# Patient Record
Sex: Female | Born: 1970 | Race: White | Hispanic: No | Smoking: Former smoker
Health system: Southern US, Community
[De-identification: ages and names within clinical notes are randomized; demographics above are authoritative.]

## PROBLEM LIST (undated history)

## (undated) DIAGNOSIS — K219 Gastro-esophageal reflux disease without esophagitis: Secondary | ICD-10-CM

## (undated) DIAGNOSIS — E0590 Thyrotoxicosis, unspecified without thyrotoxic crisis or storm: Secondary | ICD-10-CM

## (undated) DIAGNOSIS — E039 Hypothyroidism, unspecified: Secondary | ICD-10-CM

## (undated) DIAGNOSIS — N390 Urinary tract infection, site not specified: Secondary | ICD-10-CM

## (undated) DIAGNOSIS — Z9889 Other specified postprocedural states: Secondary | ICD-10-CM

## (undated) DIAGNOSIS — R112 Nausea with vomiting, unspecified: Secondary | ICD-10-CM

## (undated) DIAGNOSIS — T4145XA Adverse effect of unspecified anesthetic, initial encounter: Secondary | ICD-10-CM

## (undated) DIAGNOSIS — T8859XA Other complications of anesthesia, initial encounter: Secondary | ICD-10-CM

## (undated) HISTORY — DX: Urinary tract infection, site not specified: N39.0

## (undated) HISTORY — PX: CARPAL TUNNEL RELEASE: SHX101

## (undated) HISTORY — DX: Thyrotoxicosis, unspecified without thyrotoxic crisis or storm: E05.90

## (undated) HISTORY — DX: Hypothyroidism, unspecified: E03.9

---
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
## 2000-01-05 ENCOUNTER — Ambulatory Visit (HOSPITAL_COMMUNITY): Admission: RE | Admit: 2000-01-05 | Discharge: 2000-01-05 | Payer: Self-pay | Admitting: Family Medicine

## 2000-01-06 ENCOUNTER — Encounter: Payer: Self-pay | Admitting: Family Medicine

## 2003-01-28 ENCOUNTER — Emergency Department (HOSPITAL_COMMUNITY): Admission: EM | Admit: 2003-01-28 | Discharge: 2003-01-28 | Payer: Self-pay | Admitting: Emergency Medicine

## 2007-04-24 ENCOUNTER — Encounter: Admission: RE | Admit: 2007-04-24 | Discharge: 2007-04-24 | Payer: Self-pay | Admitting: Endocrinology

## 2007-05-12 ENCOUNTER — Encounter: Admission: RE | Admit: 2007-05-12 | Discharge: 2007-05-12 | Payer: Self-pay | Admitting: Endocrinology

## 2008-04-19 ENCOUNTER — Encounter: Admission: RE | Admit: 2008-04-19 | Discharge: 2008-04-19 | Payer: Self-pay | Admitting: Endocrinology

## 2011-08-17 DIAGNOSIS — M25579 Pain in unspecified ankle and joints of unspecified foot: Secondary | ICD-10-CM | POA: Insufficient documentation

## 2013-10-03 DIAGNOSIS — E039 Hypothyroidism, unspecified: Secondary | ICD-10-CM | POA: Insufficient documentation

## 2015-05-14 LAB — HM PAP SMEAR

## 2017-07-12 ENCOUNTER — Ambulatory Visit: Payer: 59 | Admitting: Obstetrics and Gynecology

## 2017-07-13 ENCOUNTER — Other Ambulatory Visit: Payer: Self-pay | Admitting: Nurse Practitioner

## 2017-07-13 DIAGNOSIS — Z1231 Encounter for screening mammogram for malignant neoplasm of breast: Secondary | ICD-10-CM

## 2017-08-03 ENCOUNTER — Encounter: Payer: Self-pay | Admitting: Obstetrics and Gynecology

## 2017-08-03 ENCOUNTER — Other Ambulatory Visit: Payer: Self-pay

## 2017-08-03 ENCOUNTER — Ambulatory Visit (INDEPENDENT_AMBULATORY_CARE_PROVIDER_SITE_OTHER): Payer: 59 | Admitting: Obstetrics and Gynecology

## 2017-08-03 VITALS — BP 108/74 | HR 76 | Ht 67.0 in | Wt 207.0 lb

## 2017-08-03 DIAGNOSIS — Z1231 Encounter for screening mammogram for malignant neoplasm of breast: Secondary | ICD-10-CM | POA: Diagnosis not present

## 2017-08-03 DIAGNOSIS — Z30432 Encounter for removal of intrauterine contraceptive device: Secondary | ICD-10-CM | POA: Diagnosis not present

## 2017-08-03 DIAGNOSIS — Z01419 Encounter for gynecological examination (general) (routine) without abnormal findings: Secondary | ICD-10-CM | POA: Diagnosis not present

## 2017-08-03 DIAGNOSIS — Z1239 Encounter for other screening for malignant neoplasm of breast: Secondary | ICD-10-CM

## 2017-08-03 DIAGNOSIS — N920 Excessive and frequent menstruation with regular cycle: Secondary | ICD-10-CM

## 2017-08-03 NOTE — Progress Notes (Signed)
PCP:  Patient, No Pcp Per   Chief Complaint  Patient presents with  . Gynecologic Exam    IUD Removal. No complaints     HPI:      [name] is a 46 y.o. [phone] who LMP was Patient's last menstrual period was 07/14/2017., presents today for her annual examination.  Her menses are regular every 28-30 days, lasting 2 days.  Dysmenorrhea none. She does not have intermenstrual bleeding. Mirena placed 02/16/12 for menorrhagia. Doesn't want replacement for now.   Sex activity: not sexually active.  Last Pap: May 14, 2015  Results were: no abnormalities /neg HPV DNA   Last mammogram: Sched 08/17/17 There is no FH of breast cancer. There is no FH of ovarian cancer. The patient does do self-breast exams.  Tobacco use: The patient denies current or previous tobacco use. Alcohol use: none No drug use.  Exercise: not active  She does get adequate calcium but not Vitamin D in her diet.   Past Medical History:  Diagnosis Date  . Hyperthyroidism   . Hypothyroidism   . UTI (urinary tract infection)     Past Surgical History:  Procedure Laterality Date  . CARPAL TUNNEL RELEASE      Family History  Problem Relation Age of Onset  . Hypertension Mother   . Hypertension Father   . Skin cancer Father [id number]  . Hypertension Maternal Grandmother   . Heart disease Maternal Grandmother   . Hypertension Maternal Grandfather     Social History   Socioeconomic History  . Marital status: Divorced    Spouse name: Not on file  . Number of children: Not on file  . Years of education: Not on file  . Highest education level: Not on file  Social Needs  . Financial resource strain: Not on file  . Food insecurity - worry: Not on file  . Food insecurity - inability: Not on file  . Transportation needs - medical: Not on file  . Transportation needs - non-medical: Not on file  Occupational History  . Occupation: Nurse  Tobacco Use  . Smoking status: Former Smoker    Packs/day:  0.25    Years: 10.00    Pack years: 2.50    Types: Cigarettes  . Smokeless tobacco: Never Used  Substance and Sexual Activity  . Alcohol use: No    Frequency: Never  . Drug use: No  . Sexual activity: Not Currently    Birth control/protection: IUD  Other Topics Concern  . Not on file  Social History Narrative  . Not on file    Current Meds  Medication Sig  . cetirizine (ZYRTEC) 10 MG tablet Take 10 mg by mouth daily.  Marland Kitchen. levothyroxine (SYNTHROID, LEVOTHROID) 25 MCG tablet Take 25 mcg by mouth daily before breakfast.     ROS:  Review of Systems  Constitutional: Negative for fatigue, fever and unexpected weight change.  Respiratory: Negative for cough, shortness of breath and wheezing.   Cardiovascular: Negative for chest pain, palpitations and leg swelling.  Gastrointestinal: Negative for blood in stool, constipation, diarrhea, nausea and vomiting.  Endocrine: Negative for cold intolerance, heat intolerance and polyuria.  Genitourinary: Negative for dyspareunia, dysuria, flank pain, frequency, genital sores, hematuria, menstrual problem, pelvic pain, urgency, vaginal bleeding, vaginal discharge and vaginal pain.  Musculoskeletal: Negative for back pain, joint swelling and myalgias.  Skin: Negative for rash.  Neurological: Negative for dizziness, syncope, light-headedness, numbness and headaches.  Hematological: Negative for adenopathy.  Psychiatric/Behavioral: Negative  for agitation, confusion, sleep disturbance and suicidal ideas. The patient is not nervous/anxious.      Objective: BP 108/74 (BP Location: Left Arm, Patient Position: Sitting, Cuff Size: Normal)   Pulse 76   Ht 5\' 7"  (1.702 m)   Wt 207 lb (93.9 kg)   LMP 07/14/2017   BMI 32.42 kg/m    Physical Exam  Constitutional: She is oriented to person, place, and time. She appears well-developed and well-nourished.  Genitourinary: Vagina normal and uterus normal. There is no rash or tenderness on the right  labia. There is no rash or tenderness on the left labia. No erythema or tenderness in the vagina. No vaginal discharge found. Right adnexum does not display mass and does not display tenderness. Left adnexum does not display mass and does not display tenderness.  Cervix exhibits visible IUD strings. Cervix does not exhibit motion tenderness or polyp. Uterus is not enlarged or tender.  Neck: Normal range of motion. No thyromegaly present.  Cardiovascular: Normal rate, regular rhythm and normal heart sounds.  No murmur heard. Pulmonary/Chest: Effort normal and breath sounds normal. Right breast exhibits no mass, no nipple discharge, no skin change and no tenderness. Left breast exhibits no mass, no nipple discharge, no skin change and no tenderness.  Abdominal: Soft. There is no tenderness. There is no guarding.  Musculoskeletal: Normal range of motion.  Neurological: She is alert and oriented to person, place, and time. No cranial nerve deficit.  Psychiatric: She has a normal mood and affect. Her behavior is normal.  Vitals reviewed.   IUD Removal Strings of IUD identified and grasped.  IUD removed without problem with ring forceps.  Pt tolerated this well.  IUD noted to be intact.  Assessment:  Encounter for annual routine gynecological examination  Screening for breast cancer - Pt has mammo sched 12/18.  Encounter for removal of intrauterine contraceptive device (IUD)  Menorrhagia with regular cycle - In the past, relieved with IUD. Pt wants to assess cycles and will f/u prn. Would want something other than IUD. F/u prn.           GYN counsel breast self exam, mammography screening, menopause, adequate intake of calcium and vitamin D, diet and exercise     F/U  Return in about 1 year (around 08/03/2018).  Shyanna Klingel B. Jonatan Wilsey, PA-C 08/03/2017 4:10 PM

## 2017-08-03 NOTE — Patient Instructions (Signed)
I value your feedback and entrusting us with your care. If you get a [facility] patient survey, I would appreciate you taking the time to let us know about your experience today. Thank you! 

## 2017-08-09 ENCOUNTER — Telehealth: Payer: Self-pay

## 2017-08-09 NOTE — Telephone Encounter (Signed)
Spoke with pt. Wants another Mirena. Wants to come 08/17/17 at 3:30 appt slot for Mirena IUD insertion. [facility]-Admin, pls sched appt. Pt aware.

## 2017-08-09 NOTE — Telephone Encounter (Signed)
Pt states that on 12/5 she had her IUD removed started spotting then sat having a full blown period, still bleeding heavy with clots. Pt states this is how her periods was before the iud, should she get another iud or ablation, she can not continue like this , pt advised its probably  a period. What should she do ?

## 2017-08-09 NOTE — Telephone Encounter (Signed)
She can have another IUD or RTO with MD to discuss ablation. If wants another IUD, sched while bleeding, but doesn't have to be heavy day.

## 2017-08-09 NOTE — Telephone Encounter (Signed)
Pt wants to know what do you recommend?

## 2017-08-10 NOTE — Telephone Encounter (Signed)
Pt needed to reschedule due to appt schedule at some time on 08/17/17. Pt has reschedule to 08/15/17 with [facility] for Mirena insertion

## 2017-08-10 NOTE — Telephone Encounter (Signed)
Pt is schedule 08/17/17 for mirena insertion

## 2017-08-15 ENCOUNTER — Encounter: Payer: Self-pay | Admitting: Obstetrics and Gynecology

## 2017-08-15 ENCOUNTER — Ambulatory Visit: Payer: 59 | Admitting: Obstetrics and Gynecology

## 2017-08-15 VITALS — BP 114/72 | HR 83 | Ht 67.0 in | Wt 208.0 lb

## 2017-08-15 DIAGNOSIS — N920 Excessive and frequent menstruation with regular cycle: Secondary | ICD-10-CM

## 2017-08-15 DIAGNOSIS — N938 Other specified abnormal uterine and vaginal bleeding: Secondary | ICD-10-CM

## 2017-08-15 DIAGNOSIS — Z30430 Encounter for insertion of intrauterine contraceptive device: Secondary | ICD-10-CM | POA: Diagnosis not present

## 2017-08-15 MED ORDER — LEVONORGESTREL 20 MCG/24HR IU IUD: 1.0000 | INTRAUTERINE_SYSTEM | Freq: Once | INTRAUTERINE | 0 refills | Status: DC

## 2017-08-15 NOTE — Patient Instructions (Signed)
I value your feedback and entrusting us with your care. If you get a [facility] patient survey, I would appreciate you taking the time to let us know about your experience today. Thank you! 

## 2017-08-15 NOTE — Progress Notes (Signed)
   Chief Complaint  Patient presents with  . Contraception    IUD insert     IUD PROCEDURE NOTE:  Kaitlyn Jordan L Hodges is a 46 y.o. [phone] here for Mirena  IUD insertion. No GYN concerns.  Hx of menorrhagia that was controlled with Mirena. Menses were light for about 2 days each month. Mirena removed 08/03/17 at annual due to expiration. Pt wanted to see what cycles would do and declined replacement. Menses started shortly after IUD removal and was very heavy for 5 days, soiling clothes, with clots. Flow has since decreased but been going on for almost 1 1/2 wks now. Wants new IUD for period control. Pt is not sex active.   BP 114/72 (BP Location: Left Arm, Patient Position: Sitting, Cuff Size: Normal)   Pulse 83   Ht 5\' 7"  (1.702 m)   Wt 208 lb (94.3 kg)   BMI 32.58 kg/m   IUD Insertion Procedure Note Patient identified, informed consent performed, consent signed.   Discussed risks of irregular bleeding, cramping, infection, malpositioning or misplacement of the IUD outside the uterus which may require further procedure such as laparoscopy, risk of failure <1%. Time out was performed.   Speculum placed in the vagina.  Cervix visualized.  Cleaned with Betadine x 2.  Grasped anteriorly with a single tooth tenaculum.  Uterus sounded to 9.0  cm.   IUD placed per manufacturer's recommendations.  Strings trimmed to 3 cm. Tenaculum was removed, good hemostasis noted.  Patient tolerated procedure well.   Endometrial Biopsy  PROCEDURE NOTE:  Pipelle endometrial biopsy was performed using aseptic technique with iodine preparation.  The uterus was sounded to a length of 9.0 cm.  Adequate sampling was obtained with minimal blood loss.  The patient tolerated the procedure well.  Disposition will be pending pathology.  ASSESSMENT:  Encounter for IUD insertion - Plan: levonorgestrel (MIRENA) 20 MCG/24HR IUD  DUB (dysfunctional uterine bleeding) - EMB done today while inserting IUD. Will f/u with  results. If neg, see if menses controlled again with Mirena.  - Plan: Pathology  Menorrhagia with regular cycle - New IUD today to see if controls sx again. F/u in 4 wks/sooner prn. - Plan: Pathology   Meds ordered this encounter  Medications  . levonorgestrel (MIRENA) 20 MCG/24HR IUD    Sig: 1 Intra Uterine Device (1 each total) by Intrauterine route once for 1 dose.    Dispense:  1 each    Refill:  0   Plan:  Patient was given post-procedure instructions.  She was advised to have backup contraception for one week.   Call if you are having increasing pain, cramps or bleeding or if you have a fever greater than 100.4 degrees F., shaking chills, nausea or vomiting. Patient was also asked to check IUD strings periodically and follow up in 4 weeks for IUD check.  Return in about 4 weeks (around 09/12/2017) for IUD check.  Tanieka Pownall B. Xaden Kaufman, PA-C 08/16/2017 10:03 AM

## 2017-08-16 ENCOUNTER — Encounter: Payer: Self-pay | Admitting: Obstetrics and Gynecology

## 2017-08-17 ENCOUNTER — Ambulatory Visit: Payer: 59 | Admitting: Obstetrics and Gynecology

## 2017-08-17 ENCOUNTER — Ambulatory Visit
Admission: RE | Admit: 2017-08-17 | Discharge: 2017-08-17 | Disposition: A | Discharge status: Home / Self Care | Payer: 59 | Source: Ambulatory Visit | Attending: Nurse Practitioner | Admitting: Nurse Practitioner

## 2017-08-17 DIAGNOSIS — Z1231 Encounter for screening mammogram for malignant neoplasm of breast: Secondary | ICD-10-CM | POA: Diagnosis present

## 2017-08-17 LAB — PATHOLOGY

## 2017-08-24 ENCOUNTER — Inpatient Hospital Stay
Admission: RE | Admit: 2017-08-24 | Discharge: 2017-08-24 | Disposition: A | Discharge status: Home / Self Care | Payer: Self-pay | Source: Ambulatory Visit | Attending: *Deleted | Admitting: *Deleted

## 2017-08-24 ENCOUNTER — Other Ambulatory Visit: Payer: Self-pay | Admitting: *Deleted

## 2017-08-24 DIAGNOSIS — Z9289 Personal history of other medical treatment: Secondary | ICD-10-CM

## 2017-10-25 NOTE — Telephone Encounter (Signed)
Mirena received 08/15/17

## 2018-03-24 DIAGNOSIS — E785 Hyperlipidemia, unspecified: Secondary | ICD-10-CM | POA: Insufficient documentation

## 2018-07-12 ENCOUNTER — Other Ambulatory Visit: Payer: Self-pay | Admitting: Obstetrics and Gynecology

## 2018-07-12 DIAGNOSIS — Z1231 Encounter for screening mammogram for malignant neoplasm of breast: Secondary | ICD-10-CM

## 2018-09-06 ENCOUNTER — Ambulatory Visit
Admission: RE | Admit: 2018-09-06 | Discharge: 2018-09-06 | Disposition: A | Discharge status: Home / Self Care | Payer: BLUE CROSS/BLUE SHIELD | Source: Ambulatory Visit | Attending: Obstetrics and Gynecology | Admitting: Obstetrics and Gynecology

## 2018-09-06 DIAGNOSIS — Z1231 Encounter for screening mammogram for malignant neoplasm of breast: Secondary | ICD-10-CM | POA: Insufficient documentation

## 2019-08-20 ENCOUNTER — Other Ambulatory Visit: Payer: Self-pay | Admitting: Nurse Practitioner

## 2019-08-20 DIAGNOSIS — Z1231 Encounter for screening mammogram for malignant neoplasm of breast: Secondary | ICD-10-CM

## 2019-09-10 ENCOUNTER — Ambulatory Visit
Admission: RE | Admit: 2019-09-10 | Discharge: 2019-09-10 | Disposition: A | Discharge status: Home / Self Care | Payer: BLUE CROSS/BLUE SHIELD | Source: Ambulatory Visit | Attending: Nurse Practitioner | Admitting: Nurse Practitioner

## 2019-09-10 DIAGNOSIS — Z1231 Encounter for screening mammogram for malignant neoplasm of breast: Secondary | ICD-10-CM | POA: Insufficient documentation

## 2019-09-10 DIAGNOSIS — S83512A Sprain of anterior cruciate ligament of left knee, initial encounter: Secondary | ICD-10-CM | POA: Insufficient documentation

## 2019-09-11 ENCOUNTER — Other Ambulatory Visit: Payer: Self-pay | Admitting: Surgery

## 2019-09-17 ENCOUNTER — Other Ambulatory Visit: Payer: Self-pay

## 2019-09-17 ENCOUNTER — Encounter
Admission: RE | Admit: 2019-09-17 | Discharge: 2019-09-17 | Disposition: A | Discharge status: Home / Self Care | Payer: BLUE CROSS/BLUE SHIELD | Source: Ambulatory Visit | Attending: Surgery | Admitting: Surgery

## 2019-09-17 DIAGNOSIS — Z01818 Encounter for other preprocedural examination: Secondary | ICD-10-CM | POA: Diagnosis present

## 2019-09-17 HISTORY — DX: Other specified postprocedural states: R11.2

## 2019-09-17 HISTORY — DX: Gastro-esophageal reflux disease without esophagitis: K21.9

## 2019-09-17 HISTORY — DX: Other complications of anesthesia, initial encounter: T88.59XA

## 2019-09-17 HISTORY — DX: Other specified postprocedural states: Z98.890

## 2019-09-17 NOTE — Patient Instructions (Signed)
Your procedure is scheduled on: Friday 1/22 Report to Day Surgery. To find out your arrival time please call [phone] between 1PM - 3PM on Thurs 1/21.  Remember: Instructions that are not followed completely may result in serious medical risk,  up to and including death, or upon the discretion of your surgeon and anesthesiologist your  surgery may need to be rescheduled.     _X__ 1. Do not eat food after midnight the night before your procedure.                 No gum chewing or hard candies. You may drink clear liquids up to 2 hours                 before you are scheduled to arrive for your surgery- DO not drink clear                 liquids within 2 hours of the start of your surgery.                 Clear Liquids include:  water, apple juice without pulp, clear carbohydrate                 drink such as Clearfast of Gatorade, Black Coffee or Tea (Do not add                 anything to coffee or tea).  __X__2.  On the morning of surgery brush your teeth with toothpaste and water, you                may rinse your mouth with mouthwash if you wish.  Do not swallow any toothpaste of mouthwash.     _X__ 3.  No Alcohol for 24 hours before or after surgery.   ___ 4.  Do Not Smoke or use e-cigarettes For 24 Hours Prior to Your Surgery.                 Do not use any chewable tobacco products for at least 6 hours prior to                 surgery.  ____  5.  Bring all medications with you on the day of surgery if instructed.   __x__  6.  Notify your doctor if there is any change in your medical condition      (cold, fever, infections).     Do not wear jewelry, make-up, hairpins, clips or nail polish. Do not wear lotions, powders, or perfumes. You may wear deodorant. Do not shave 48 hours prior to surgery. Men may shave face and neck. Do not bring valuables to the hospital.    [facility] is not responsible for any belongings or valuables.  Contacts,  dentures or bridgework may not be worn into surgery. Leave your suitcase in the car. After surgery it may be brought to your room. For patients admitted to the hospital, discharge time is determined by your treatment team.   Patients discharged the day of surgery will not be allowed to drive home.   Please read over the following fact sheets that you were given:     __x__ Take these medicines the morning of surgery with A SIP OF WATER:    1. cetirizine (ZYRTEC) 10 MG tablet  2. levothyroxine (SYNTHROID) 88 MCG tablet  3. omeprazole (PRILOSEC) 20 MG capsule  Dose the night before and one the morning of surgery  4.  5.  6.  ____ Fleet Enema (as directed)   __x__ Use CHG Soap as directed  ____ Use inhalers on the day of surgery  ____ Stop metformin 2 days prior to surgery    ____ Take 1/2 of usual insulin dose the night before surgery. No insulin the morning          of surgery.   ____ Stop Coumadin/Plavix/aspirin on   __x__ Stop Anti-inflammatories ibuprofen (ADVIL) 200 MG tablet today      May take tylenol   _x___ Stop supplements until after surgery.  TURMERIC PO  ____ Bring C-Pap to the hospital.

## 2019-09-19 ENCOUNTER — Other Ambulatory Visit
Admission: RE | Admit: 2019-09-19 | Discharge: 2019-09-19 | Disposition: A | Discharge status: Home / Self Care | Payer: BLUE CROSS/BLUE SHIELD | Source: Ambulatory Visit | Attending: Surgery | Admitting: Surgery

## 2019-09-19 ENCOUNTER — Other Ambulatory Visit: Payer: Self-pay

## 2019-09-19 DIAGNOSIS — Z01812 Encounter for preprocedural laboratory examination: Secondary | ICD-10-CM | POA: Insufficient documentation

## 2019-09-19 DIAGNOSIS — Z20822 Contact with and (suspected) exposure to covid-19: Secondary | ICD-10-CM | POA: Insufficient documentation

## 2019-09-19 LAB — SARS CORONAVIRUS 2 (TAT 6-24 HRS): SARS Coronavirus 2: NEGATIVE

## 2019-09-21 ENCOUNTER — Ambulatory Visit: Payer: BLUE CROSS/BLUE SHIELD | Admitting: Certified Registered"

## 2019-09-21 ENCOUNTER — Ambulatory Visit
Admission: RE | Admit: 2019-09-21 | Discharge: 2019-09-21 | Disposition: A | Discharge status: Home / Self Care | Payer: BLUE CROSS/BLUE SHIELD | Attending: Surgery | Admitting: Surgery

## 2019-09-21 ENCOUNTER — Other Ambulatory Visit: Payer: Self-pay

## 2019-09-21 ENCOUNTER — Ambulatory Visit: Payer: BLUE CROSS/BLUE SHIELD

## 2019-09-21 ENCOUNTER — Encounter
Admission: RE | Disposition: A | Discharge status: Home / Self Care | Payer: Self-pay | Source: Home / Self Care | Attending: Surgery

## 2019-09-21 ENCOUNTER — Encounter: Payer: Self-pay | Admitting: Surgery

## 2019-09-21 DIAGNOSIS — Z419 Encounter for procedure for purposes other than remedying health state, unspecified: Secondary | ICD-10-CM

## 2019-09-21 DIAGNOSIS — K219 Gastro-esophageal reflux disease without esophagitis: Secondary | ICD-10-CM | POA: Insufficient documentation

## 2019-09-21 DIAGNOSIS — Z79890 Hormone replacement therapy (postmenopausal): Secondary | ICD-10-CM | POA: Diagnosis not present

## 2019-09-21 DIAGNOSIS — Z87891 Personal history of nicotine dependence: Secondary | ICD-10-CM | POA: Diagnosis not present

## 2019-09-21 DIAGNOSIS — Z79899 Other long term (current) drug therapy: Secondary | ICD-10-CM | POA: Insufficient documentation

## 2019-09-21 DIAGNOSIS — W109XXA Fall (on) (from) unspecified stairs and steps, initial encounter: Secondary | ICD-10-CM | POA: Insufficient documentation

## 2019-09-21 DIAGNOSIS — S83512A Sprain of anterior cruciate ligament of left knee, initial encounter: Secondary | ICD-10-CM | POA: Insufficient documentation

## 2019-09-21 HISTORY — PX: KNEE ARTHROSCOPY WITH ANTERIOR CRUCIATE LIGAMENT (ACL) REPAIR: SHX5644

## 2019-09-21 LAB — POCT PREGNANCY, URINE: Preg Test, Ur: NEGATIVE

## 2019-09-21 SURGERY — KNEE ARTHROSCOPY WITH ANTERIOR CRUCIATE LIGAMENT (ACL) REPAIR
Anesthesia: General | Site: Knee | Laterality: Left

## 2019-09-21 MED ORDER — PROMETHAZINE HCL 25 MG/ML IJ SOLN
INTRAMUSCULAR | Status: AC
  Administered 2019-09-21: 6.25 mg via INTRAVENOUS
  Filled 2019-09-21: qty 1

## 2019-09-21 MED ORDER — PROPOFOL 500 MG/50ML IV EMUL
INTRAVENOUS | Status: AC
  Filled 2019-09-21: qty 50

## 2019-09-21 MED ORDER — CHLORHEXIDINE GLUCONATE 4 % EX LIQD: 60.0000 mL | Freq: Once | CUTANEOUS | Status: DC

## 2019-09-21 MED ORDER — SODIUM CHLORIDE FLUSH 0.9 % IV SOLN
INTRAVENOUS | Status: AC
  Filled 2019-09-21: qty 10

## 2019-09-21 MED ORDER — LIDOCAINE HCL (PF) 1 % IJ SOLN
INTRAMUSCULAR | Status: AC
  Filled 2019-09-21: qty 5

## 2019-09-21 MED ORDER — KETAMINE HCL 50 MG/ML IJ SOLN
INTRAMUSCULAR | Status: DC | PRN
  Administered 2019-09-21: 25 mg via INTRAMUSCULAR

## 2019-09-21 MED ORDER — ONDANSETRON HCL 4 MG/2ML IJ SOLN
INTRAMUSCULAR | Status: DC | PRN
  Administered 2019-09-21: 4 mg via INTRAVENOUS

## 2019-09-21 MED ORDER — PROPOFOL 10 MG/ML IV BOLUS: INTRAVENOUS | Status: DC | PRN

## 2019-09-21 MED ORDER — LIDOCAINE HCL (PF) 1 % IJ SOLN
INTRAMUSCULAR | Status: AC
  Filled 2019-09-21: qty 30

## 2019-09-21 MED ORDER — SODIUM CHLORIDE 0.9 % IV SOLN
INTRAVENOUS | Status: DC | PRN
  Administered 2019-09-21: 30 mL

## 2019-09-21 MED ORDER — GLYCOPYRROLATE 0.2 MG/ML IJ SOLN
INTRAMUSCULAR | Status: DC | PRN
  Administered 2019-09-21: .2 mg via INTRAVENOUS

## 2019-09-21 MED ORDER — ACETAMINOPHEN 10 MG/ML IV SOLN
INTRAVENOUS | Status: DC | PRN
  Administered 2019-09-21: 1000 mg via INTRAVENOUS

## 2019-09-21 MED ORDER — OXYCODONE HCL 5 MG/5ML PO SOLN: 5.0000 mg | Freq: Once | ORAL | Status: DC | PRN

## 2019-09-21 MED ORDER — FENTANYL CITRATE (PF) 100 MCG/2ML IJ SOLN
25.0000 ug | INTRAMUSCULAR | Status: DC | PRN
  Administered 2019-09-21 (×3): 25 ug via INTRAVENOUS

## 2019-09-21 MED ORDER — MIDAZOLAM HCL 2 MG/2ML IJ SOLN
INTRAMUSCULAR | Status: AC
  Administered 2019-09-21: 1 mg via INTRAVENOUS
  Filled 2019-09-21: qty 2

## 2019-09-21 MED ORDER — ROPIVACAINE HCL 5 MG/ML IJ SOLN
INTRAMUSCULAR | Status: DC | PRN
  Administered 2019-09-21: 10 mg via PERINEURAL
  Administered 2019-09-21: 20 mg via PERINEURAL

## 2019-09-21 MED ORDER — CEFAZOLIN SODIUM-DEXTROSE 2-4 GM/100ML-% IV SOLN
2.0000 g | INTRAVENOUS | Status: AC
  Administered 2019-09-21: 10:00:00 2 g via INTRAVENOUS

## 2019-09-21 MED ORDER — KETAMINE HCL 50 MG/ML IJ SOLN
INTRAMUSCULAR | Status: AC
  Filled 2019-09-21: qty 10

## 2019-09-21 MED ORDER — FENTANYL CITRATE (PF) 100 MCG/2ML IJ SOLN
INTRAMUSCULAR | Status: AC
  Administered 2019-09-21: 25 ug via INTRAVENOUS
  Filled 2019-09-21: qty 2

## 2019-09-21 MED ORDER — DEXAMETHASONE SODIUM PHOSPHATE 10 MG/ML IJ SOLN
INTRAMUSCULAR | Status: DC | PRN
  Administered 2019-09-21: 10 mg via INTRAVENOUS

## 2019-09-21 MED ORDER — LACTATED RINGERS IV SOLN: INTRAVENOUS | Status: DC

## 2019-09-21 MED ORDER — FENTANYL CITRATE (PF) 100 MCG/2ML IJ SOLN: 100.0000 ug | Freq: Once | INTRAMUSCULAR | Status: AC

## 2019-09-21 MED ORDER — PROMETHAZINE HCL 25 MG/ML IJ SOLN: 6.2500 mg | INTRAMUSCULAR | Status: DC | PRN

## 2019-09-21 MED ORDER — TRAMADOL HCL 50 MG PO TABS
ORAL_TABLET | ORAL | Status: AC
  Filled 2019-09-21: qty 1

## 2019-09-21 MED ORDER — OXYCODONE HCL 5 MG PO TABS: 5.0000 mg | ORAL_TABLET | Freq: Once | ORAL | Status: DC | PRN

## 2019-09-21 MED ORDER — KETOROLAC TROMETHAMINE 30 MG/ML IJ SOLN
INTRAMUSCULAR | Status: DC | PRN
  Administered 2019-09-21: 30 mg via INTRAVENOUS

## 2019-09-21 MED ORDER — PROPOFOL 10 MG/ML IV BOLUS
INTRAVENOUS | Status: AC
  Filled 2019-09-21: qty 20

## 2019-09-21 MED ORDER — KETOROLAC TROMETHAMINE 30 MG/ML IJ SOLN: 30.0000 mg | Freq: Once | INTRAMUSCULAR | Status: DC

## 2019-09-21 MED ORDER — FENTANYL CITRATE (PF) 100 MCG/2ML IJ SOLN
INTRAMUSCULAR | Status: DC | PRN
  Administered 2019-09-21: 12.5 ug via INTRAVENOUS
  Administered 2019-09-21 (×3): 25 ug via INTRAVENOUS
  Administered 2019-09-21: 12.5 ug via INTRAVENOUS

## 2019-09-21 MED ORDER — DEXMEDETOMIDINE HCL 200 MCG/2ML IV SOLN
INTRAVENOUS | Status: DC | PRN
  Administered 2019-09-21: 12 ug via INTRAVENOUS

## 2019-09-21 MED ORDER — ACETAMINOPHEN 10 MG/ML IV SOLN
INTRAVENOUS | Status: AC
  Filled 2019-09-21: qty 100

## 2019-09-21 MED ORDER — LIDOCAINE HCL 1 % IJ SOLN
INTRAMUSCULAR | Status: DC | PRN
  Administered 2019-09-21: 60 mL

## 2019-09-21 MED ORDER — TRAMADOL HCL 50 MG PO TABS
50.0000 mg | ORAL_TABLET | Freq: Once | ORAL | Status: AC
  Administered 2019-09-21: 50 mg via ORAL
  Filled 2019-09-21: qty 1

## 2019-09-21 MED ORDER — FENTANYL CITRATE (PF) 100 MCG/2ML IJ SOLN
INTRAMUSCULAR | Status: AC
  Filled 2019-09-21: qty 2

## 2019-09-21 MED ORDER — CEFAZOLIN SODIUM-DEXTROSE 2-4 GM/100ML-% IV SOLN
INTRAVENOUS | Status: AC
  Filled 2019-09-21: qty 100

## 2019-09-21 MED ORDER — ROPIVACAINE HCL 5 MG/ML IJ SOLN
INTRAMUSCULAR | Status: AC
  Filled 2019-09-21: qty 30

## 2019-09-21 MED ORDER — FENTANYL CITRATE (PF) 100 MCG/2ML IJ SOLN
INTRAMUSCULAR | Status: AC
  Administered 2019-09-21: 50 ug via INTRAVENOUS
  Filled 2019-09-21: qty 2

## 2019-09-21 MED ORDER — MIDAZOLAM HCL 2 MG/2ML IJ SOLN: 2.0000 mg | Freq: Once | INTRAMUSCULAR | Status: AC

## 2019-09-21 MED ORDER — TRANEXAMIC ACID 1000 MG/10ML IV SOLN
INTRAVENOUS | Status: AC
  Filled 2019-09-21: qty 10

## 2019-09-21 MED ORDER — LIDOCAINE HCL (CARDIAC) PF 100 MG/5ML IV SOSY
PREFILLED_SYRINGE | INTRAVENOUS | Status: DC | PRN
  Administered 2019-09-21: 100 mg via INTRAVENOUS

## 2019-09-21 MED ORDER — PROPOFOL 500 MG/50ML IV EMUL
INTRAVENOUS | Status: DC | PRN
  Administered 2019-09-21: 125 ug/kg/min via INTRAVENOUS
  Administered 2019-09-21: 200 mg via INTRAVENOUS
  Administered 2019-09-21: 30 mg via INTRAVENOUS
  Administered 2019-09-21: 50 mg via INTRAVENOUS
  Administered 2019-09-21: 20 mg via INTRAVENOUS

## 2019-09-21 MED ORDER — TRANEXAMIC ACID 1000 MG/10ML IV SOLN
INTRAVENOUS | Status: DC | PRN
  Administered 2019-09-21: 1000 mg via TOPICAL

## 2019-09-21 MED ORDER — EPINEPHRINE PF 1 MG/ML IJ SOLN
INTRAMUSCULAR | Status: AC
  Filled 2019-09-21: qty 1

## 2019-09-21 MED ORDER — BUPIVACAINE LIPOSOME 1.3 % IJ SUSP
INTRAMUSCULAR | Status: AC
  Filled 2019-09-21: qty 20

## 2019-09-21 MED ORDER — BUPIVACAINE HCL (PF) 0.5 % IJ SOLN
INTRAMUSCULAR | Status: AC
  Filled 2019-09-21: qty 30

## 2019-09-21 SURGICAL SUPPLY — 72 items
ADAPTER IRRIG TUBE 2 SPIKE SOL (ADAPTER) ×2 IMPLANT
BASIN GRAD PLASTIC 32OZ STRL (MISCELLANEOUS) ×2 IMPLANT
BIT DRILL 2.0 (BIT) ×1
BIT DRILL 2XNS DISP SS SM FRAG (BIT) ×1 IMPLANT
BIT DRL 2XNS DISP SS SM FRAG (BIT) ×1
BLADE FULL RADIUS 3.5 (BLADE) ×2 IMPLANT
BLADE OSC/SAGITTAL MD 9X18.5 (BLADE) ×2 IMPLANT
BLADE SURG SZ10 CARB STEEL (BLADE) ×5 IMPLANT
BNDG COHESIVE 6X5 TAN STRL LF (GAUZE/BANDAGES/DRESSINGS) ×2 IMPLANT
BNDG ELASTIC 6X5.8 VLCR STR LF (GAUZE/BANDAGES/DRESSINGS) ×3 IMPLANT
BNDG ESMARK 6X12 TAN STRL LF (GAUZE/BANDAGES/DRESSINGS) ×2 IMPLANT
BRACE KNEE POST OP SHORT (BRACE) IMPLANT
BUR 5.5 NOTCHBLASTER STR (BURR) ×1 IMPLANT
BUR ACROMIONIZER 4.0 (BURR) IMPLANT
BURR 5.5 NOTCHBLASTER STR (BURR) ×2
CHLORAPREP W/TINT 26 (MISCELLANEOUS) ×2 IMPLANT
COOLER POLAR GLACIER W/PUMP (MISCELLANEOUS) IMPLANT
COVER BACK TABLE REUSABLE LG (DRAPES) ×2 IMPLANT
COVER LIGHT HANDLE STERIS (MISCELLANEOUS) ×4 IMPLANT
COVER WAND RF STERILE (DRAPES) ×2 IMPLANT
CUFF TOURN SGL QUICK 24 (TOURNIQUET CUFF)
CUFF TOURN SGL QUICK 30 (TOURNIQUET CUFF) ×1
CUFF TRNQT CYL 24X4X16.5-23 (TOURNIQUET CUFF) IMPLANT
CUFF TRNQT CYL 30X4X21-28X (TOURNIQUET CUFF) IMPLANT
DRAPE 3/4 80X56 (DRAPES) ×5 IMPLANT
DRAPE IMP U-DRAPE 54X76 (DRAPES) ×2 IMPLANT
DRSG OPSITE POSTOP 3X4 (GAUZE/BANDAGES/DRESSINGS) ×1 IMPLANT
DRSG OPSITE POSTOP 4X8 (GAUZE/BANDAGES/DRESSINGS) ×1 IMPLANT
GAUZE SPONGE 4X4 12PLY STRL (GAUZE/BANDAGES/DRESSINGS) ×4 IMPLANT
GLOVE BIO SURGEON STRL SZ7.5 (GLOVE) ×6 IMPLANT
GLOVE BIO SURGEON STRL SZ8 (GLOVE) ×7 IMPLANT
GLOVE BIOGEL PI IND STRL 8 (GLOVE) ×1 IMPLANT
GLOVE BIOGEL PI INDICATOR 8 (GLOVE) ×1
GLOVE INDICATOR 8.0 STRL GRN (GLOVE) ×2 IMPLANT
GOWN STRL REUS W/ TWL LRG LVL3 (GOWN DISPOSABLE) ×1 IMPLANT
GOWN STRL REUS W/ TWL XL LVL3 (GOWN DISPOSABLE) ×1 IMPLANT
GOWN STRL REUS W/TWL LRG LVL3 (GOWN DISPOSABLE) ×3
GOWN STRL REUS W/TWL XL LVL3 (GOWN DISPOSABLE) ×1
GUIDEWIRE 1.2MMX18 (WIRE) ×1 IMPLANT
HANDLE YANKAUER SUCT BULB TIP (MISCELLANEOUS) ×2 IMPLANT
IV LACTATED RINGER IRRG 3000ML (IV SOLUTION) ×4
IV LR IRRIG 3000ML ARTHROMATIC (IV SOLUTION) ×4 IMPLANT
KIT GUIDE PIN AND WIRE PACK (WIRE) ×1 IMPLANT
KIT TURNOVER KIT A (KITS) ×2 IMPLANT
MANIFOLD NEPTUNE II (INSTRUMENTS) ×2 IMPLANT
MAT ABSORB  FLUID 56X50 GRAY (MISCELLANEOUS) ×1
MAT ABSORB FLUID 56X50 GRAY (MISCELLANEOUS) ×2 IMPLANT
NDL HYPO 21X1.5 SAFETY (NEEDLE) ×1 IMPLANT
NDL SAFETY ECLIPSE 18X1.5 (NEEDLE) ×1 IMPLANT
NEEDLE HYPO 18GX1.5 SHARP (NEEDLE) ×1
NEEDLE HYPO 21X1.5 SAFETY (NEEDLE) ×2 IMPLANT
NS IRRIG 500ML POUR BTL (IV SOLUTION) ×1 IMPLANT
PACK ARTHROSCOPY KNEE (MISCELLANEOUS) ×2 IMPLANT
PAD ABD DERMACEA PRESS 5X9 (GAUZE/BANDAGES/DRESSINGS) ×2 IMPLANT
PAD WRAPON POLAR KNEE (MISCELLANEOUS) ×1 IMPLANT
PENCIL SMOKE EVACUATOR (MISCELLANEOUS) ×2 IMPLANT
PUSHER KNOT ARTHRO 360DEG (MISCELLANEOUS) IMPLANT
SCREW SOFTSILK 1.5 7X25 (Screw) ×2 IMPLANT
SPONGE LAP 18X18 RF (DISPOSABLE) ×2 IMPLANT
STAPLER SKIN PROX 35W (STAPLE) ×2 IMPLANT
STRAP SAFETY 5IN WIDE (MISCELLANEOUS) ×1 IMPLANT
SUT ORTHOCORD OS-6 NDL 36 (SUTURE) ×8 IMPLANT
SUT VIC AB 2-0 CT1 (SUTURE) ×1 IMPLANT
SUT VIC AB 2-0 CT1 27 (SUTURE) ×4
SUT VIC AB 2-0 CT1 TAPERPNT 27 (SUTURE) ×4 IMPLANT
SYR 10ML LL (SYRINGE) ×1 IMPLANT
SYR 50ML LL SCALE MARK (SYRINGE) ×2 IMPLANT
SYR BULB IRRIG 60ML STRL (SYRINGE) ×2 IMPLANT
TOWEL OR 17X26 4PK STRL BLUE (TOWEL DISPOSABLE) ×6 IMPLANT
TUBING ARTHRO INFLOW-ONLY STRL (TUBING) ×2 IMPLANT
WAND WEREWOLF FLOW 90D (MISCELLANEOUS) ×2 IMPLANT
WRAPON POLAR PAD KNEE (MISCELLANEOUS) ×2

## 2019-09-21 NOTE — Anesthesia Postprocedure Evaluation (Signed)
Anesthesia Post Note  Patient: Kaitlyn Jordan  Procedure(s) Performed: KNEE ARTHROSCOPY WITH ANTERIOR CRUCIATE LIGAMENT (ACL) REPAIR, BONE PATELLA TENDON AUTOGRAFT (Left Knee)  Patient location during evaluation: PACU Anesthesia Type: General Level of consciousness: awake and alert Pain management: pain level controlled Vital Signs Assessment: post-procedure vital signs reviewed and stable Respiratory status: spontaneous breathing, nonlabored ventilation, respiratory function stable and patient connected to nasal cannula oxygen Cardiovascular status: blood pressure returned to baseline and stable Postop Assessment: no apparent nausea or vomiting Anesthetic complications: no     Last Vitals:  Vitals:   09/21/19 1310 09/21/19 1325  BP: (!) 141/96 (!) 143/97  Pulse: (!) 57 67  Resp: 10 13  Temp:    SpO2: 100% 100%    Last Pain:  Vitals:   09/21/19 1346  TempSrc:   PainSc: 5                  Cleda Mccreedy Aylan Bayona

## 2019-09-21 NOTE — Op Note (Signed)
09/21/2019  12:29 PM  Patient:   Kaitlyn Jordan  Pre-Op Diagnosis:   Anterior cruciate ligament tear, left knee.  Post-Op Diagnosis:   Same.  Procedure:   Arthroscopically assisted anterior cruciate ligament reconstruction using bone-patella tendon-bone autograft, left knee.  Surgeon:   Maryagnes Amos, MD  Assistant:   Horris Latino, PA-C; Barrie Dunker, PA-S  Anesthesia:   General laryngeal mask anesthesia with an adductor canal block placed preoperatively by the anesthesiologist.  Findings:   As above. Both the medial and lateral menisci were in satisfactory condition. There were grade 1-2 chondromalacial changes involving the lateral tibial plateau and the central portion of the femoral trochlea. The remaining portions of the articular surfaces all were in satisfactory condition. The posterior cruciate ligament also was in excellent condition.  Complications:   None  EBL:   10 cc  Fluids:   500 cc crystalloid  TT:   85 minutes at 300 mmHg  Drains:   None  Closure:   Staples  Implants:   BioSilk interference screws 2  Brief Clinical Note:   The patient is a 49 year old female who sustained a twisting injury to her left knee several weeks ago when she tripped while descending some stairs in her home. Subsequent work-up confirmed the presence of an isolated ACL tear. She continues to have discomfort and a feeling as though her knee wants to give out on her. The patient presents at this time for arthroscopy, debridement, and reconstruction of the anterior cruciate ligament using bone-patella tendon-bone autograft.  Procedure:   The patient underwent placement of an adductor canal block in the preoperative holding area before being brought into the operating room and lain in the supine position. After adequate general laryngal mask anesthesia was obtained, a timeout was performed to verify the appropriate surgical site and side. An examination under anesthesia demonstrated a 1-2+  Lachman and a 1+ pivot shift. The knee was injected sterilely using a solution of 30 cc of 0.5% Sensorcaine with epinephrine and 30 cc of 1% lidocaine. The left lower extremity was prepped with ChloraPrep solution before being draped sterilely. Preoperative antibiotics were administered. The limb was exsanguinated with an Esmarch and the tourniquet inflated to 300 mmHg.   An incision was made over the anterior aspect of the knee beginning at the inferior pole of the patella and extending distally and slightly medially to the tibial tubercle. The incision was carried down through the subcutaneous cutaneous tissues to expose the superficial retinaculum. This was split the length of the incision and the medial and lateral flaps elevated sufficiently to expose the medial and lateral borders of the patellar tendon. A Kelly clamp was passed beneath the patella tendon and the tendon width measured and found to be 30 mm. A 10 mm graft was marked and harvested using a #10 blade. Contiguous bone plugs from both the patella and the tibial tubercle were harvested using an oscillating saw and osteotomes. Two drill holes were placed into each bone plug prior to removing it from the field. The graft was taken to the back table where it was prepared for later reimplantation. Meanwhile, the superficial fibers of the patellar tendon defect were loosely reapproximated using 2-0 Vicryl interrupted sutures.  The arthroscopic portion of the procedure was begun. Subcutaneous anteromedial and anterolateral portals were created before the camera was placed in the anterolateral portal and instrumentation performed through the anteromedial portal. The knee was sequentially examined beginning in the suprapatellar pouch, then progressing to the patellofemoral  space, the medial gutter and compartment, the notch, and finally the lateral compartment and gutter. Abundant reactive synovial tissues anteriorly were debrided in order to improve  visualization. The findings were as described above.  The menisci were probed and found to be stable/intact.  Attention was redirected to the notch. The remnants of the anterior cruciate ligament were debrided from the femoral and tibial sides before a notchplasty was performed. Care was taken to be sure the over-the-top position was identified using a Soil scientist. The tibial guide was positioned and the guidewire drilled up through the proximal tibia into the notch. After verifying its position, it was overreamed with a 10 mm straight reamer. The bony debris was removed using the full-radius resector. Through the tibial tunnel, a 7 mm over-the-top guide was positioned at approximately the 2:00 position. With the knee flexed to 90, a guide wire was drilled up into the distal femur. The 9 mm acorn reamer was placed over the guidewire. A footprint was made and assessed to be sure that the femoral socket would not blow out the back of the lateral femoral condyle before the acorn reamer was advanced to the appropriate depth to accommodate the femoral bone plug. Again the bony debris was removed using the full-radius resector.   With the knee flexed beyond 90, the Two-pin passer was passed up through the tibial tunnel into the femoral socket, then advanced so that it exited through the anterolateral aspect of the distal thigh. A Hyperflex wire was passed through the anteromedial portal and advanced so that it engaged the groove of the Two-pin passer before it too was advanced approximately to exit from the antero-lateral thigh. The sutures that had been placed through the femoral plug were passed through the eye of the Two-pin passer before the Two-pin passer was advanced proximally, pulling the graft through the tibial tunnel into the femoral socket. Once it was seated securely, the femoral plug was secured using a 7 x 25 mm BioSilk screw. Distal traction demonstrated excellent fixation. The knee was brought  into extension to be sure that there would be no impingement upon the roof the notch. The tibial plug was secured using a 9 x 25 mm BioSilk screw with a posterior drawer force applied to the knee. Once the graft was secured, a gentle Lachman maneuver demonstrated excellent stability with less than 2 mm of anterior displacement. A gentle pivot shift was negative.  The scope was reintroduced to be sure that tibial screw did not enter the joint before the instruments removed from the joint after suctioning the excess fluid. Bone graft material that had been harvested from the tibial tunnel reamings were packed into the patella and tibial tubercle defects before the superficial retinacular layer was reapproximated using 2-0 Vicryl interrupted sutures. The subcutaneous tissues were closed in two layers using 2-0 Vicryl interrupted sutures before the skin was closed using staples. A sterile bulky dressing was applied to the knee, incorporating a Polar Care pad, before the patient was placed into a hinged knee brace with the hinges set at 0-90 but locked in extension. The patient was then awakened, extubated, and returned to the recovery room in satisfactory condition after tolerating the procedure well.

## 2019-09-21 NOTE — Anesthesia Procedure Notes (Signed)
Anesthesia Regional Block: Adductor canal block   Pre-Anesthetic Checklist: ,, timeout performed, Correct Patient, Correct Site, Correct Laterality, Correct Procedure, Correct Position, site marked, Risks and benefits discussed,  Surgical consent,  Pre-op evaluation,  At surgeon's request and post-op pain management  Laterality: Lower and Left  Prep: chloraprep       Needles:  Injection technique: Single-shot  Needle Type: Echogenic Needle     Needle Length: 9cm  Needle Gauge: 21     Additional Needles:   Procedures:,,,, ultrasound used (permanent image in chart),,,,  Narrative:  Start time: 09/21/2019 9:52 AM End time: 09/21/2019 9:56 AM Injection made incrementally with aspirations every 5 mL.  Performed by: Personally  Anesthesiologist: Vanessa Alesi, Cleda Mccreedy, MD  Additional Notes: Patient consented for risk and benefits of nerve block including but not limited to nerve damage, failed block, bleeding and infection.  Patient voiced understanding.  Functioning IV was confirmed and monitors were applied.  A echogenic needle was used. Sterile prep,hand hygiene and sterile gloves were used. Minimal sedation used for procedure.   No paresthesia endorsed by patient during the procedure.  Negative aspiration and negative test dose prior to incremental administration of local anesthetic. The patient tolerated the procedure well with no immediate complications.

## 2019-09-21 NOTE — Anesthesia Procedure Notes (Signed)
Procedure Name: LMA Insertion Performed by: Paysen Goza L, CRNA Pre-anesthesia Checklist: Patient identified, Patient being monitored, Timeout performed, Emergency Drugs available and Suction available Patient Re-evaluated:Patient Re-evaluated prior to induction Oxygen Delivery Method: Circle system utilized Preoxygenation: Pre-oxygenation with 100% oxygen Induction Type: IV induction Ventilation: Mask ventilation without difficulty LMA: LMA inserted LMA Size: 4.0 Tube type: Oral Number of attempts: 1 Placement Confirmation: positive ETCO2 and breath sounds checked- equal and bilateral Tube secured with: Tape Dental Injury: Teeth and Oropharynx as per pre-operative assessment        

## 2019-09-21 NOTE — Discharge Instructions (Addendum)
AMBULATORY SURGERY  DISCHARGE INSTRUCTIONS   1) The drugs that you were given will stay in your system until tomorrow so for the next 24 hours you should not:  A) Drive an automobile B) Make any legal decisions C) Drink any alcoholic beverage   2) You may resume regular meals tomorrow.  Today it is better to start with liquids and gradually work up to solid foods.  You may eat anything you prefer, but it is better to start with liquids, then soup and crackers, and gradually work up to solid foods.   3) Please notify your doctor immediately if you have any unusual bleeding, trouble breathing, redness and pain at the surgery site, drainage, fever, or pain not relieved by medication.    4) Additional Instructions:        Please contact your physician with any problems or Same Day Surgery at [phone], Monday through Friday 6 am to 4 pm, or [place] [facility] number at [phone].  Orthopedic discharge instructions: May shower with an intact OpSite dressing. Apply ice frequently to knee or use Polar Care. Take ibuprofen 800 mg TID with meals for 7-10 days, then as necessary. Take tramadol as prescribed when needed.  May supplement with ES Tylenol if necessary. May weight-bear as tolerated so long as in brace locked in extension - use crutches as needed. Follow-up in 10-14 days or as scheduled.

## 2019-09-21 NOTE — Evaluation (Signed)
Physical Therapy Evaluation Patient Details Name: Kaitlyn Jordan MRN: [id number] DOB: Sep 17, 1970 Today's Date: 09/21/2019   History of Present Illness  Pt is a 49 yo female diagnosed with Anterior cruciate ligament tear, left knee, and is s/p arthroscopically assisted anterior cruciate ligament reconstruction using bone-patella tendon-bone autograft.    Clinical Impression  Pt pleasant and motivated to participate throughout the session.  Max L knee pain 3/10 with no other adverse symptoms reported other than consistent mild dizziness that pt stated was from medications and was not made worse with position changes or activity.  Pt did not require physical assistance with any functional task and was steady with transfer, gait, and stair training with good carryover regarding proper sequencing.  Pt will benefit from PT services per surgeon's recommendation upon discharge to safely address deficits listed in patient problem list for decreased caregiver assistance and eventual return to PLOF.     Follow Up Recommendations Follow surgeon's recommendation for DC plan and follow-up therapies    Equipment Recommendations  None recommended by PT;Other (comment)(Pt stated would obtain all recommended equipment (RW and BSC) independently)    Recommendations for Other Services       Precautions / Restrictions Precautions Precautions: Fall Required Braces or Orthoses: Knee Immobilizer - Left Knee Immobilizer - Left: Other (comment)(KI set to 0-90 deg, locked in full ext with WB) Restrictions Weight Bearing Restrictions: Yes LLE Weight Bearing: Weight bearing as tolerated Other Position/Activity Restrictions: LLE PWB to WBAT with knee brace locked at 0 deg; May unlock in sitting to do L knee AROM or PROM between 0-90 deg.      Mobility  Bed Mobility Overal bed mobility: Modified Independent             General bed mobility comments: Extra time and effort required  Transfers Overall  transfer level: Needs assistance Equipment used: Rolling walker (2 wheeled);Crutches Transfers: Sit to/from Stand Sit to Stand: Min guard         General transfer comment: Mod verbal cues for sequencing with good carryover and control  Ambulation/Gait Ambulation/Gait assistance: Min guard Gait Distance (Feet): 100 Feet Assistive device: Rolling walker (2 wheeled);Crutches Gait Pattern/deviations: Step-to pattern Gait velocity: decreased   General Gait Details: Pt preferred step-to pattern with training provided with both crutches and RW with pt steady with both  Stairs Stairs: Yes Stairs assistance: Min guard Stair Management: With crutches;Forwards Number of Stairs: 4 General stair comments: Verbal education for sequencing with visual demonstration followed by pt practice with good control and stability and mod verbal cues for sequencing with good carryover  Wheelchair Mobility    Modified Rankin (Stroke Patients Only)       Balance Overall balance assessment: Needs assistance   Sitting balance-Leahy Scale: Normal     Standing balance support: Bilateral upper extremity supported Standing balance-Leahy Scale: Good                               Pertinent Vitals/Pain Pain Assessment: 0-10 Pain Score: 3  Pain Location: L knee Pain Descriptors / Indicators: Sore Pain Intervention(s): Premedicated before session;Monitored during session    Home Living Family/patient expects to be discharged to:: Private residence Living Arrangements: Children Available Help at Discharge: Family;Available 24 hours/day Type of Home: House Home Access: Stairs to enter Entrance Stairs-Rails: None Entrance Stairs-Number of Steps: 3 Home Layout: One level Home Equipment: Crutches;Walker - 4 wheels Additional Comments: Pt stated would be getting a  BSC on her own.    Prior Function Level of Independence: Independent         Comments: Pt is a nurse, active, no other  fall history, community ambulator without an AD, Ind with all ADLs     Hand Dominance        Extremity/Trunk Assessment   Upper Extremity Assessment Upper Extremity Assessment: Overall WFL for tasks assessed    Lower Extremity Assessment Lower Extremity Assessment: Generalized weakness;LLE deficits/detail LLE Deficits / Details: LLE hip flex >/= 3/5, full ankle AROM LLE: Unable to fully assess due to pain LLE Sensation: WNL       Communication   Communication: No difficulties  Cognition Arousal/Alertness: Awake/alert Behavior During Therapy: WFL for tasks assessed/performed Overall Cognitive Status: Within Functional Limits for tasks assessed                                        General Comments      Exercises Total Joint Exercises Ankle Circles/Pumps: AROM;Both;10 reps Hip ABduction/ADduction: AROM;Left;5 reps Straight Leg Raises: AROM;Left;5 reps Long Arc Quad: AROM;AAROM;Left;10 reps Knee Flexion: AROM;AAROM;Left;10 reps Goniometric ROM: L knee AROM grossly 0-45 deg Other Exercises Other Exercises: Car transfer sequencing verbal education Other Exercises: HEP education for gentle L knee AROM 0-90 deg x 10 reps 3-4x/day initially and principles of slowly progressing frequency Other Exercises: KI education/review for locking/unlocking and to ensure brace locked in full ext with WB Other Exercises: Sit to/from stand transfer training from various height surfaces with cues for sequencing   Assessment/Plan    PT Assessment Patient needs continued PT services  PT Problem List Decreased strength;Decreased range of motion;Decreased activity tolerance;Decreased balance;Decreased mobility;Decreased knowledge of use of DME;Decreased knowledge of precautions;Pain       PT Treatment Interventions DME instruction;Gait training;Stair training;Functional mobility training;Therapeutic activities;Therapeutic exercise;Balance training;Patient/family education     PT Goals (Current goals can be found in the Care Plan section)  Acute Rehab PT Goals Patient Stated Goal: To be able to be active outdoors PT Goal Formulation: With patient Time For Goal Achievement: 10/04/19 Potential to Achieve Goals: Good    Frequency BID   Barriers to discharge        Co-evaluation               AM-PAC PT "6 Clicks" Mobility  Outcome Measure Help needed turning from your back to your side while in a flat bed without using bedrails?: A Little Help needed moving from lying on your back to sitting on the side of a flat bed without using bedrails?: A Little Help needed moving to and from a bed to a chair (including a wheelchair)?: A Little Help needed standing up from a chair using your arms (e.g., wheelchair or bedside chair)?: A Little Help needed to walk in hospital room?: A Little Help needed climbing 3-5 steps with a railing? : A Little 6 Click Score: 18    End of Session Equipment Utilized During Treatment: Gait belt Activity Tolerance: Patient tolerated treatment well Patient left: in chair;with nursing/sitter in room Nurse Communication: Mobility status PT Visit Diagnosis: Pain;Muscle weakness (generalized) (M62.81);Other abnormalities of gait and mobility (R26.89) Pain - Right/Left: Left Pain - part of body: Knee    Time: 1358-1500 PT Time Calculation (min) (ACUTE ONLY): 62 min   Charges:   PT Evaluation $PT Eval Moderate Complexity: 1 Mod PT Treatments $Gait Training: 23-37 mins $Therapeutic  Exercise: 8-22 mins $Therapeutic Activity: 8-22 mins        D. Elly Modena PT, DPT 09/21/19, 3:37 PM

## 2019-09-21 NOTE — Transfer of Care (Signed)
Immediate Anesthesia Transfer of Care Note  Patient: Kaitlyn Jordan  Procedure(s) Performed: KNEE ARTHROSCOPY WITH ANTERIOR CRUCIATE LIGAMENT (ACL) REPAIR, BONE PATELLA TENDON AUTOGRAFT (Left Knee)  Patient Location: PACU  Anesthesia Type:General  Level of Consciousness: awake, alert  and oriented  Airway & Oxygen Therapy: Patient Spontanous Breathing  Post-op Assessment: Report given to RN and Post -op Vital signs reviewed and stable  Post vital signs: Reviewed and stable  Last Vitals:  Vitals Value Taken Time  BP 135/90 09/21/19 1240  Temp    Pulse 65 09/21/19 1243  Resp 16 09/21/19 1243  SpO2 99 % 09/21/19 1243  Vitals shown include unvalidated device data.  Last Pain:  Vitals:   09/21/19 0934  TempSrc: Temporal  PainSc: 0-No pain         Complications: No apparent anesthesia complications

## 2019-09-21 NOTE — Anesthesia Preprocedure Evaluation (Addendum)
Anesthesia Evaluation  Patient identified by MRN, date of birth, ID band Patient awake    Reviewed: Allergy & Precautions, H&P , NPO status , Patient's Chart, lab work & pertinent test results  History of Anesthesia Complications (+) PONV and history of anesthetic complications  Airway Mallampati: III  TM Distance: >3 FB Neck ROM: full    Dental  (+) Chipped   Pulmonary neg shortness of breath, former smoker,           Cardiovascular Exercise Tolerance: Good (-) angina(-) Past MI and (-) DOE negative cardio ROS       Neuro/Psych negative neurological ROS  negative psych ROS   GI/Hepatic Neg liver ROS, GERD  Medicated and Controlled,  Endo/Other    Renal/GU      Musculoskeletal   Abdominal   Peds  Hematology negative hematology ROS (+)   Anesthesia Other Findings Past Medical History: No date: Complication of anesthesia No date: GERD (gastroesophageal reflux disease) No date: Hyperthyroidism No date: Hypothyroidism No date: PONV (postoperative nausea and vomiting) No date: UTI (urinary tract infection)  Past Surgical History: No date: CARPAL TUNNEL RELEASE     Reproductive/Obstetrics negative OB ROS                             Anesthesia Physical Anesthesia Plan  ASA: III  Anesthesia Plan: General LMA   Post-op Pain Management: GA combined w/ Regional for post-op pain   Induction: Intravenous  PONV Risk Score and Plan: Dexamethasone, Ondansetron, Midazolam, Treatment may vary due to age or medical condition, Propofol infusion and TIVA  Airway Management Planned: LMA  Additional Equipment:   Intra-op Plan:   Post-operative Plan: Extubation in OR  Informed Consent: I have reviewed the patients History and Physical, chart, labs and discussed the procedure including the risks, benefits and alternatives for the proposed anesthesia with the patient or authorized  representative who has indicated his/her understanding and acceptance.     Dental Advisory Given  Plan Discussed with: Anesthesiologist, CRNA and Surgeon  Anesthesia Plan Comments: (Patient consented for risks of anesthesia including but not limited to:  - adverse reactions to medications - damage to teeth, lips or other oral mucosa - sore throat or hoarseness - Damage to heart, brain, lungs or loss of life  Patient voiced understanding.)       Anesthesia Quick Evaluation

## 2019-09-21 NOTE — H&P (Signed)
Paper H&P to be scanned into permanent record. H&P reviewed and patient re-examined. No changes. 

## 2020-03-13 ENCOUNTER — Other Ambulatory Visit: Payer: Self-pay | Admitting: Podiatry

## 2020-03-13 DIAGNOSIS — M65871 Other synovitis and tenosynovitis, right ankle and foot: Secondary | ICD-10-CM

## 2020-03-21 ENCOUNTER — Other Ambulatory Visit (INDEPENDENT_AMBULATORY_CARE_PROVIDER_SITE_OTHER): Payer: Self-pay | Admitting: Nurse Practitioner

## 2020-03-21 DIAGNOSIS — I83819 Varicose veins of unspecified lower extremities with pain: Secondary | ICD-10-CM

## 2020-03-24 ENCOUNTER — Encounter (INDEPENDENT_AMBULATORY_CARE_PROVIDER_SITE_OTHER): Payer: Self-pay | Admitting: Nurse Practitioner

## 2020-03-24 ENCOUNTER — Ambulatory Visit (INDEPENDENT_AMBULATORY_CARE_PROVIDER_SITE_OTHER): Payer: BLUE CROSS/BLUE SHIELD | Admitting: Nurse Practitioner

## 2020-03-24 ENCOUNTER — Encounter (INDEPENDENT_AMBULATORY_CARE_PROVIDER_SITE_OTHER): Payer: 59 | Admitting: Nurse Practitioner

## 2020-03-24 ENCOUNTER — Ambulatory Visit (INDEPENDENT_AMBULATORY_CARE_PROVIDER_SITE_OTHER): Payer: BLUE CROSS/BLUE SHIELD

## 2020-03-24 ENCOUNTER — Other Ambulatory Visit: Payer: Self-pay

## 2020-03-24 ENCOUNTER — Encounter (INDEPENDENT_AMBULATORY_CARE_PROVIDER_SITE_OTHER): Payer: Self-pay

## 2020-03-24 VITALS — BP 121/81 | HR 87 | Resp 16 | Ht 67.0 in | Wt 219.0 lb

## 2020-03-24 DIAGNOSIS — E785 Hyperlipidemia, unspecified: Secondary | ICD-10-CM | POA: Diagnosis not present

## 2020-03-24 DIAGNOSIS — I83819 Varicose veins of unspecified lower extremities with pain: Secondary | ICD-10-CM | POA: Diagnosis not present

## 2020-03-24 DIAGNOSIS — I83813 Varicose veins of bilateral lower extremities with pain: Secondary | ICD-10-CM | POA: Diagnosis not present

## 2020-03-26 ENCOUNTER — Encounter (INDEPENDENT_AMBULATORY_CARE_PROVIDER_SITE_OTHER): Payer: Self-pay | Admitting: Nurse Practitioner

## 2020-03-26 NOTE — Progress Notes (Signed)
Subjective:    Patient ID: Kaitlyn Jordan, female    DOB: 1971-06-14, 49 y.o.   MRN: [id number] Chief Complaint  Patient presents with  . New Patient (Initial Visit)    ref [place] varicose veins w/pain    The patient is seen for evaluation of symptomatic varicose veins. The patient relates burning and stinging which worsened steadily throughout the course of the day, particularly with standing. The patient also notes an aching and throbbing pain over the varicosities, particularly with prolonged dependent positions. The symptoms are significantly improved with elevation.  The patient also notes that during hot weather the symptoms are greatly intensified. The patient states the pain from the varicose veins interferes with work, daily exercise, shopping and household maintenance. At this point, the symptoms are persistent and severe enough that they're having a negative impact on lifestyle and are interfering with daily activities.  There is no history of DVT, PE or superficial thrombophlebitis. There is no history of ulceration or hemorrhage. The patient denies a significant family history of varicose veins. OB history: [id number]  The patient currently works as an Charity fundraiser and wears medical grade 1 compression daily and has done so for the last several years.  Despite wearing medical grade 1 compression the patient still continues to have the pain issues described above.  The patient also uses over-the-counter NSAIDs for pain relief.  The patient also notes that she is very active getting over 12,000 steps a day.   Today the patient has noninvasive studies done.  The right lower extremity shows evidence of reflux in the great saphenous vein next ending from the saphenofemoral junction to the proximal calf.  The vein diameters range from 0.59 cm to 0.69.  Left lower extremity has evidence of deep venous insufficiency in the proximal femoral vein.  The left great saphenous vein also has reflux in the  saphenofemoral junction as well.  There was no evidence of DVT or superficial venous thrombosis seen bilaterally.  No evidence of deep venous insufficiency seen in the right lower extremity.  No evidence of venous insufficiency seen in the small saphenous veins bilaterally.   Review of Systems  Cardiovascular: Positive for leg swelling.       Painful varicosities  All other systems reviewed and are negative.      Objective:   Physical Exam Vitals reviewed.  HENT:     Head: Normocephalic.  Cardiovascular:     Rate and Rhythm: Normal rate and regular rhythm.     Pulses: Normal pulses.     Heart sounds: Normal heart sounds.     Comments: Large varicosity right lower extremity extending from the groin to the ankle area Pulmonary:     Effort: Pulmonary effort is normal.     Breath sounds: Normal breath sounds.  Musculoskeletal:        General: Normal range of motion.  Skin:    General: Skin is warm and dry.     Capillary Refill: Capillary refill takes less than 2 seconds.  Neurological:     Mental Status: She is alert and oriented to person, place, and time.  Psychiatric:        Mood and Affect: Mood normal.        Behavior: Behavior normal.        Thought Content: Thought content normal.        Judgment: Judgment normal.     BP 121/81 (BP Location: Right Arm)   Pulse 87   Resp 16  Ht 5\' 7"  (1.702 m)   Wt (!) 219 lb (99.3 kg)   BMI 34.30 kg/m   Past Medical History:  Diagnosis Date  . Complication of anesthesia   . GERD (gastroesophageal reflux disease)   . Hyperthyroidism   . Hypothyroidism   . PONV (postoperative nausea and vomiting)   . UTI (urinary tract infection)     Social History   Socioeconomic History  . Marital status: Divorced    Spouse name: Not on file  . Number of children: Not on file  . Years of education: Not on file  . Highest education level: Not on file  Occupational History  . Occupation: Nurse  Tobacco Use  . Smoking status: Former  Smoker    Packs/day: 0.25    Years: 10.00    Pack years: 2.50    Types: Cigarettes    Quit date: 09/16/2012    Years since quitting: 7.5  . Smokeless tobacco: Never Used  Vaping Use  . Vaping Use: Never used  Substance and Sexual Activity  . Alcohol use: No  . Drug use: No  . Sexual activity: Not Currently    Birth control/protection: I.U.D.  Other Topics Concern  . Not on file  Social History Narrative  . Not on file   Social Determinants of Health   Financial Resource Strain:   . Difficulty of Paying Living Expenses:   Food Insecurity:   . Worried About 09/18/2012 in the Last Year:   . Programme researcher, broadcasting/film/video in the Last Year:   Transportation Needs:   . Barista (Medical):   Freight forwarder Lack of Transportation (Non-Medical):   Physical Activity:   . Days of Exercise per Week:   . Minutes of Exercise per Session:   Stress:   . Feeling of Stress :   Social Connections:   . Frequency of Communication with Friends and Family:   . Frequency of Social Gatherings with Friends and Family:   . Attends Religious Services:   . Active Member of Clubs or Organizations:   . Attends [employer] Meetings:   Banker Marital Status:   Intimate Partner Violence:   . Fear of Current or Ex-Partner:   . Emotionally Abused:   [place] [employer] Physically Abused:   . Sexually Abused:     Past Surgical History:  Procedure Laterality Date  . CARPAL TUNNEL RELEASE    . KNEE ARTHROSCOPY WITH ANTERIOR CRUCIATE LIGAMENT (ACL) REPAIR Left 09/21/2019   Procedure: KNEE ARTHROSCOPY WITH ANTERIOR CRUCIATE LIGAMENT (ACL) REPAIR, BONE PATELLA TENDON AUTOGRAFT;  Surgeon: 09/23/2019, MD;  Location: [facility] ORS;  Service: Orthopedics;  Laterality: Left;    Family History  Problem Relation Age of Onset  . Hypertension Mother   . Hypertension Father   . Skin cancer Father 70  . Hypertension Maternal Grandmother   . Heart disease Maternal Grandmother   . Hypertension Maternal Grandfather   . Breast  cancer Neg Hx     Allergies  Allergen Reactions  . Bee Venom Swelling    Localized swelling  Localized swelling        Assessment & Plan:   1. 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 Patient will continue  wearing graduated compression stockings class 1 on a daily basis, beginning first thing in the morning and removing them in the evening.  In addition, behavioral modification including elevation during the day was again discussed and this will continue.  The patient has utilized over the counter pain medications and has been exercising.  However, at this time conservative therapy has not alleviated the patient's symptoms of leg pain and swelling  Recommend: laser ablation of the right great saphenous veins to eliminate the symptoms of pain and swelling of the lower extremities caused by the severe superficial venous reflux disease.   2. Hyperlipidemia, unspecified hyperlipidemia type Continue statin as ordered and reviewed, no changes at this time     Current Outpatient Medications on File Prior to Visit  Medication Sig Dispense Refill  . Carboxymethylcellul-Glycerin (LUBRICATING EYE DROPS OP) Place 1 drop into both eyes daily as needed (dry eyes).    . cetirizine (ZYRTEC) 10 MG tablet Take 10 mg by mouth daily.     . Cholecalciferol (VITAMIN D) 50 MCG (2000 UT) tablet Take 2,000 Units by mouth daily.    [place] EPINEPHrine (EPIPEN 2-PAK) 0.3 mg/0.3 mL IJ SOAJ injection Inject 0.3 mg into the muscle as needed (bee stings).     [place] ibuprofen (ADVIL) 200 MG tablet Take 600 mg by mouth every 6 (six) hours as needed for headache or moderate pain.    [place] levothyroxine (SYNTHROID) 88 MCG tablet Take 88 mcg by mouth daily before breakfast.    . Magnesium 400 MG CAPS Take 800 mg by mouth daily.    [place] omeprazole (PRILOSEC) 20 MG capsule Take 20 mg by  mouth daily.    . TURMERIC PO Take 720 mg by mouth daily.    . vitamin B-12 (CYANOCOBALAMIN) 1000 MCG tablet Take 1,000 mcg by mouth daily.    [place] levonorgestrel (MIRENA) 20 MCG/24HR IUD 1 Intra Uterine Device (1 each total) by Intrauterine route once for 1 dose. 1 each 0   No current facility-administered medications on file prior to visit.    There are no Patient Instructions on file for this visit. No follow-ups on file.   Georgiana Spinner, NP

## 2020-04-02 ENCOUNTER — Other Ambulatory Visit: Payer: Self-pay | Admitting: Internal Medicine

## 2020-04-03 ENCOUNTER — Ambulatory Visit
Admission: RE | Admit: 2020-04-03 | Discharge: 2020-04-03 | Disposition: A | Discharge status: Home / Self Care | Payer: BLUE CROSS/BLUE SHIELD | Source: Ambulatory Visit | Attending: Podiatry | Admitting: Podiatry

## 2020-04-03 ENCOUNTER — Other Ambulatory Visit: Payer: Self-pay

## 2020-04-03 DIAGNOSIS — M65871 Other synovitis and tenosynovitis, right ankle and foot: Secondary | ICD-10-CM | POA: Diagnosis present

## 2020-04-07 ENCOUNTER — Other Ambulatory Visit: Payer: Self-pay | Admitting: Obstetrics and Gynecology

## 2020-04-16 ENCOUNTER — Other Ambulatory Visit: Payer: Self-pay | Admitting: Podiatry

## 2020-04-16 ENCOUNTER — Telehealth (INDEPENDENT_AMBULATORY_CARE_PROVIDER_SITE_OTHER): Payer: Self-pay

## 2020-04-17 NOTE — Telephone Encounter (Signed)
A message has being left on the patient voicemail to return a call to the office

## 2020-04-17 NOTE — Telephone Encounter (Signed)
Patient has being aware with medical advice and verbalized understanding 

## 2020-04-17 NOTE — Telephone Encounter (Signed)
In this instance, I would reschedule your laser until after your foot surgery.  This is due to the fact that the laser will cause increased inflammation.  Generally this is not an issue because you would be up and mobile but to essentially become immobile a week after the ablation would put you at an increased risk for DVT.  So, to err on the side of caution, I would postpone the laser.

## 2020-04-22 NOTE — H&P (Signed)
Chief Complaint:   [name] is a 49 y.o. female here for Pre Op Consulting (sign consents)  History of Present Illness:  Patientt presents for a preoperative visit to schedule a D&C, hysteroscopy, and Novasure Ablation. Indications: longstanding severe menorrhagia, failed multiple interventions.   Patient had her Mirena IUD recently removed d/t her side effects. Pt has Provera 10mg  for use as needed for bleeding   Workup has included: Last pap 12/2019: neg/+HPV EMBx 02/2020: Blood clot & small fragments of underdeveloped endometrial glands and decidualized stroma showing chronic endometritis, and markedly degenerate tissue showing features suggestive of placenta consistent with retained products of conception. No hyperplasia or carcinoma. TVUS 10/2019: Ut wnl     7.53 x 3.76 x 4.68 cm Endometrium=6.94 mm  iud seen in fundus      Neither ov seen No adnexal    masses seen  SIS findings: unremarkable, thin stripe with normal regular cavity, no mass lesions  Past Medical History:  has a past medical history of Chickenpox, DUB (dysfunctional uterine bleeding), GERD (gastroesophageal reflux disease), Menorrhagia with regular cycle, and Thyroid disease.  Past Surgical History:  has a past surgical history that includes Carpal Tunnel Release (Right, 2000); Colonoscopy (07/20/2018); and Arthroscopically assisted anterior cruciate ligament reconstruction using bone-patella tendon-bone autograft, left knee. (Left, 09/21/2019). Family History: family history includes High blood pressure (Hypertension) in her father and mother; Hyperlipidemia (Elevated cholesterol) in her father and mother. Social History:  reports that she has quit smoking. She has never used smokeless tobacco. She reports previous alcohol use. She reports that she does not use drugs. OB/GYN History:  OB History    Gravida  4   Para  4   Term  3   Preterm  1   AB      Living  4     SAB      TAB      Ectopic       Molar      Multiple      Live Births  4        Allergies: is allergic to venom-honey bee. Medications:  Current Outpatient Medications:  .  cetirizine (ZYRTEC) 10 MG tablet, Take by mouth Take 10 mg by mouth daily., Disp: , Rfl:  .  cholecalciferol, vitamin D3, (VITAMIN D3 ORAL), Take 1 tablet by mouth once daily, Disp: , Rfl:  .  cyanocobalamin (VITAMIN B12) 1000 MCG tablet, Take by mouth 1 tablet daily, Disp: , Rfl:  .  EUTHYROX 100 mcg tablet, Take 100 mcg by mouth once daily, Disp: , Rfl:  .  ibuprofen (MOTRIN) 200 MG tablet, Take by mouth Take 600 mg by mouth every 6 (six) hours as needed for headache or moderate pain., Disp: , Rfl:  .  MAGNESIUM CHLORIDE ORAL, Take 1 tablet by mouth once daily, Disp: , Rfl:  .  medroxyPROGESTERone (PROVERA) 10 MG tablet, Take 1 tablet (10 mg total) by mouth once daily for 60 days (Patient not taking: Reported on 04/15/2020  ), Disp: 60 tablet, Rfl: 0 .  omeprazole (PRILOSEC) 20 MG DR capsule, Take 1 capsule (20 mg total) by mouth once daily, Disp: 90 capsule, Rfl: 3 .  TURMERIC ORAL, Take 1 tablet by mouth once daily, Disp: , Rfl:   Review of Systems: No SOB, no palpitations or chest pain, no new lower extremity edema, no nausea or vomiting or bowel or bladder complaints. See HPI for gyn specific ROS.   Exam:   BP 136/72   Ht  170.2 cm (5\' 7" )   Wt 96.9 kg (213 lb 9.6 oz)   BMI 33.45 kg/m   Constitutional:  General appearance: Well nourished, well developed female in no acute distress.  CV: RRR Pulm: CTAB Neuro/psych:  Normal mood and affect. No gross motor deficits. Neck:  Supple, normal appearance.  Respiratory:  Normal respiratory effort, no use of accessory muscles Skin:  No visible rashes or external lesions  Impression:   The primary encounter diagnosis was Preoperative clearance. A diagnosis of Excessive or frequent menstruation was also pertinent to this visit.  Plan:   1. Preoperative visit: D&C hysteroscopy, Novasure  Ablation. Consents signed today.  -Risks of surgery were discussed with the patient including but not limited to: bleeding which may require transfusion; infection which may require antibiotics; injury to uterus or surrounding organs; intrauterine scarring which may impair future fertility; need for additional procedures including laparotomy or laparoscopy; and other postoperative/anesthesia complications. Written informed consent was obtained.  -Pt prefers tramadol over percocet b/c percocet gives nausea  Diagnoses and all orders for this visit:  Preoperative clearance  Excessive or frequent menstruation   Return if symptoms worsen or fail to improve.  ~~~~~~~~~~~~~~~~~~~~~~~~~~~~~~~~~~~~~~~~~~~~~~~~~~~~~~~~~~~~ This note is partially written by , in the presence of and acting as the scribe of Dr. Thompson Caul, who has reviewed, edited and added to the note to reflect her best personal medical judgment.  This note was generated in part with voice recognition software and I apologize for any typographical errors that were not detected and corrected.  [name], MD

## 2020-04-24 ENCOUNTER — Encounter: Payer: BLUE CROSS/BLUE SHIELD | Admitting: Vascular Surgery

## 2020-04-24 DIAGNOSIS — H02839 Dermatochalasis of unspecified eye, unspecified eyelid: Secondary | ICD-10-CM | POA: Insufficient documentation

## 2020-04-24 DIAGNOSIS — H02403 Unspecified ptosis of bilateral eyelids: Secondary | ICD-10-CM | POA: Insufficient documentation

## 2020-04-25 ENCOUNTER — Inpatient Hospital Stay: Admission: RE | Admit: 2020-04-25 | Payer: BLUE CROSS/BLUE SHIELD | Source: Ambulatory Visit

## 2020-04-28 ENCOUNTER — Other Ambulatory Visit: Payer: Self-pay

## 2020-04-28 ENCOUNTER — Encounter
Admission: RE | Admit: 2020-04-28 | Discharge: 2020-04-28 | Disposition: A | Discharge status: Home / Self Care | Payer: BLUE CROSS/BLUE SHIELD | Source: Ambulatory Visit | Attending: Obstetrics and Gynecology | Admitting: Obstetrics and Gynecology

## 2020-04-28 NOTE — Patient Instructions (Addendum)
INSTRUCTIONS FOR YOUR SURGERY WITH DR. Dalbert Garnet:  Your procedure is scheduled on: Friday 05/02/20.  Report to DAY SURGERY DEPARTMENT LOCATED ON 2ND FLOOR MEDICAL MALL ENTRANCE. To find out your arrival time please call [phone] between 1PM - 3PM on Thursday 05/01/20.   Remember: Instructions that are not followed completely may result in serious medical risk, up to and including death, or upon the discretion of your surgeon and anesthesiologist your surgery may need to be rescheduled.     __X__ 1. Do not eat food after midnight the night before your procedure.                 No gum chewing or hard candies. You may drink clear liquids up to 2 hours                 before you are scheduled to arrive for your surgery- DO NOT drink clear                 liquids within 2 hours of the start of your surgery.                 Clear Liquids include:  water, apple juice without pulp, clear carbohydrate                 drink such as Clearfast or Gatorade, Black Coffee or Tea (Do not add                 milk or creamer to coffee or tea).  ** Dr. Dalbert Garnet wants you to finish the Clear Pre-Surgery Ensure on the morning of your surgery 2 hours prior to your arrival time. **  __X__2.  On the morning of surgery brush your teeth with toothpaste and water, you may rinse your mouth with mouthwash if you wish.  Do not swallow any toothpaste or mouthwash.    __X__ 3.  No Alcohol for 24 hours before or after surgery.  __X__ 4.  Do Not Smoke or use e-cigarettes For 24 Hours Prior to Your Surgery.                 Do not use any chewable tobacco products for at least 6 hours prior to                 surgery.  __X__5.  Notify your doctor if there is any change in your medical condition      (cold, fever, infections).      Do NOT wear jewelry, make-up, hairpins, clips or nail polish. Do NOT wear lotions, powders, or perfumes.  Do NOT shave 48 hours prior to surgery. Men may shave face and neck. Do NOT bring  valuables to the hospital.     [facility] is not responsible for any belongings or valuables.   Contacts, dentures/partials or body piercings may not be worn into surgery. Bring a case for your contacts, glasses or hearing aids, a denture cup will be supplied.    Patients discharged the day of surgery will not be allowed to drive home.     __X__ Take these medicines the morning of surgery with A SIP OF WATER:     1. EUTHYROX 100 MCG tablet  2. omeprazole (PRILOSEC)   3. cetirizine (ZYRTEC)   4. Carboxymethylcellul-Glycerin (LUBRICATING EYE DROPS OP) if needed    __X__ Shower prior to arrival on the morning of surgery.  __X__ Stop Anti-inflammatories 7 days before surgery such as Advil, Ibuprofen, Motrin, BC  or Goodies Powder, Naprosyn, Naproxen, Aleve, Aspirin, Meloxicam. May take Tylenol if needed for pain or discomfort.   __X__Do not start taking any new herbal supplements or vitamins prior to your procedure.     Wear comfortable clothing (specific to your surgery type) to the hospital.  Plan for stool softeners for home use; pain medications have a tendency to cause constipation. You can also help prevent constipation by eating foods high in fiber such as fruits and vegetables and drinking plenty of fluids as your diet allows.  After surgery, you can prevent lung complications by doing breathing exercises.Take deep breaths and cough every 1-2 hours. Your doctor may order a device called an Incentive Spirometer to help you take deep breaths.  Please call the Pre-Admissions Testing Department at [phone] if you have any questions about these instructions.             INSTRUCTIONS FOR YOUR SURGERY WITH DR. Ether Griffins:  Your procedure is scheduled on: Friday 05/16/20.   Report to DAY SURGERY DEPARTMENT LOCATED ON 2ND FLOOR MEDICAL MALL ENTRANCE. To find out your arrival time please call [phone] between 1PM - 3PM on Thursday 05/15/20.   Remember:  Instructions that are not followed completely may result in serious medical risk, up to and including death, or upon the discretion of your surgeon and anesthesiologist your surgery may need to be rescheduled.     __X__ 1. Do not eat food after midnight the night before your procedure.                 No gum chewing or hard candies. You may drink clear liquids up to 2 hours                 before you are scheduled to arrive for your surgery- DO NOT drink clear                 liquids within 2 hours of the start of your surgery.                 Clear Liquids include:  water, apple juice without pulp, clear carbohydrate                 drink such as Clearfast or Gatorade, Black Coffee or Tea (Do not add                 milk or creamer to coffee or tea).   ** Dr. Ether Griffins would like for you to finish the Pre-Surgery Ensure on the morning of your surgery 2 hours prior to your arrival time.**   __X__2.  On the morning of surgery brush your teeth with toothpaste and water, you may rinse your mouth with mouthwash if you wish.  Do not swallow any toothpaste or mouthwash.    __X__ 3.  No Alcohol for 24 hours before or after surgery.  __X__ 4.  Do Not Smoke or use e-cigarettes For 24 Hours Prior to Your Surgery.                 Do not use any chewable tobacco products for at least 6 hours prior to                 surgery.  __X__5.  Notify your doctor if there is any change in your medical condition      (cold, fever, infections).      Do NOT wear jewelry, make-up, hairpins, clips or nail polish. Do NOT wear  lotions, powders, or perfumes.  Do NOT shave 48 hours prior to surgery. Men may shave face and neck. Do NOT bring valuables to the hospital.     [facility] is not responsible for any belongings or valuables.   Contacts, dentures/partials or body piercings may not be worn into surgery. Bring a case for your contacts, glasses or hearing aids, a denture cup will be supplied.   Patients  discharged the day of surgery will not be allowed to drive home.     __X__ Take these medicines the morning of surgery with A SIP OF WATER:     1. EUTHYROX 100 MCG tablet  2. omeprazole (PRILOSEC)  3. cetirizine (ZYRTEC)  4. Carboxymethylcellul-Glycerin (LUBRICATING EYE DROPS OP) if needed    __X__ Use CHG Soap as directed  __X__ Stop Anti-inflammatories 7 days before surgery such as Advil, Ibuprofen, Motrin, BC or Goodies Powder, Naprosyn, Naproxen, Aleve, Aspirin, Meloxicam. May take Tylenol if needed for pain or discomfort.   __X__Do not start taking any new herbal supplements or vitamins prior to your procedure.    Wear comfortable clothing (specific to your surgery type) to the hospital.  Plan for stool softeners for home use; pain medications have a tendency to cause constipation. You can also help prevent constipation by eating foods high in fiber such as fruits and vegetables and drinking plenty of fluids as your diet allows.  After surgery, you can prevent lung complications by doing breathing exercises.Take deep breaths and cough every 1-2 hours. Your doctor may order a device called an Incentive Spirometer to help you take deep breaths.  Please call the Pre-Admissions Testing Department at [phone] if you have any questions about these instructions.

## 2020-04-30 ENCOUNTER — Other Ambulatory Visit
Admission: RE | Admit: 2020-04-30 | Discharge: 2020-04-30 | Disposition: A | Discharge status: Home / Self Care | Payer: BLUE CROSS/BLUE SHIELD | Source: Ambulatory Visit | Attending: Obstetrics and Gynecology | Admitting: Obstetrics and Gynecology

## 2020-04-30 ENCOUNTER — Other Ambulatory Visit: Payer: Self-pay

## 2020-04-30 DIAGNOSIS — Z20822 Contact with and (suspected) exposure to covid-19: Secondary | ICD-10-CM | POA: Insufficient documentation

## 2020-04-30 DIAGNOSIS — Z01812 Encounter for preprocedural laboratory examination: Secondary | ICD-10-CM | POA: Diagnosis not present

## 2020-04-30 LAB — BASIC METABOLIC PANEL
Anion gap: 12 (ref 5–15)
BUN: 20 mg/dL (ref 6–20)
CO2: 26 mmol/L (ref 22–32)
Calcium: 10 mg/dL (ref 8.9–10.3)
Chloride: 102 mmol/L (ref 98–111)
Creatinine, Ser: 1.08 mg/dL — ABNORMAL HIGH (ref 0.44–1.00)
GFR calc Af Amer: >60 mL/min (ref >60)
GFR calc non Af Amer: >60 mL/min (ref >60)
Glucose, Bld: 87 mg/dL (ref 70–99)
Potassium: 3.8 mmol/L (ref 3.5–5.1)
Sodium: 140 mmol/L (ref 135–145)

## 2020-04-30 LAB — TYPE AND SCREEN
ABO/RH(D): A POS
Antibody Screen: NEGATIVE

## 2020-04-30 LAB — CBC
HCT: 45 % (ref 36.0–46.0)
Hemoglobin: 15.1 g/dL — ABNORMAL HIGH (ref 12.0–15.0)
MCH: 29.9 pg (ref 26.0–34.0)
MCHC: 33.6 g/dL (ref 30.0–36.0)
MCV: 89.1 fL (ref 80.0–100.0)
Platelets: 305 10*3/uL (ref 150–400)
RBC: 5.05 MIL/uL (ref 3.87–5.11)
RDW: 12.8 % (ref 11.5–15.5)
WBC: 5.9 10*3/uL (ref 4.0–10.5)
nRBC: 0 % (ref 0.0–0.2)

## 2020-04-30 LAB — SARS CORONAVIRUS 2 (TAT 6-24 HRS): SARS Coronavirus 2: NEGATIVE

## 2020-05-02 ENCOUNTER — Other Ambulatory Visit: Payer: Self-pay

## 2020-05-02 ENCOUNTER — Encounter
Admission: RE | Disposition: A | Discharge status: Home / Self Care | Payer: Self-pay | Source: Home / Self Care | Attending: Obstetrics and Gynecology

## 2020-05-02 ENCOUNTER — Ambulatory Visit: Payer: BLUE CROSS/BLUE SHIELD | Admitting: Family

## 2020-05-02 ENCOUNTER — Other Ambulatory Visit: Payer: Self-pay | Admitting: Obstetrics and Gynecology

## 2020-05-02 ENCOUNTER — Ambulatory Visit
Admission: RE | Admit: 2020-05-02 | Discharge: 2020-05-02 | Disposition: A | Discharge status: Home / Self Care | Payer: BLUE CROSS/BLUE SHIELD | Attending: Obstetrics and Gynecology | Admitting: Obstetrics and Gynecology

## 2020-05-02 ENCOUNTER — Encounter: Payer: Self-pay | Admitting: Obstetrics and Gynecology

## 2020-05-02 DIAGNOSIS — E039 Hypothyroidism, unspecified: Secondary | ICD-10-CM | POA: Insufficient documentation

## 2020-05-02 DIAGNOSIS — Z79890 Hormone replacement therapy (postmenopausal): Secondary | ICD-10-CM | POA: Insufficient documentation

## 2020-05-02 DIAGNOSIS — Z79899 Other long term (current) drug therapy: Secondary | ICD-10-CM | POA: Diagnosis not present

## 2020-05-02 DIAGNOSIS — Z87891 Personal history of nicotine dependence: Secondary | ICD-10-CM | POA: Insufficient documentation

## 2020-05-02 DIAGNOSIS — N939 Abnormal uterine and vaginal bleeding, unspecified: Secondary | ICD-10-CM | POA: Diagnosis present

## 2020-05-02 DIAGNOSIS — K219 Gastro-esophageal reflux disease without esophagitis: Secondary | ICD-10-CM | POA: Diagnosis not present

## 2020-05-02 DIAGNOSIS — N920 Excessive and frequent menstruation with regular cycle: Secondary | ICD-10-CM | POA: Diagnosis not present

## 2020-05-02 HISTORY — PX: HYSTEROSCOPY WITH D & C: SHX1775

## 2020-05-02 LAB — POCT PREGNANCY, URINE: Preg Test, Ur: NEGATIVE

## 2020-05-02 SURGERY — DILATATION AND CURETTAGE /HYSTEROSCOPY
Anesthesia: General

## 2020-05-02 MED ORDER — ORAL CARE MOUTH RINSE: 15.0000 mL | Freq: Once | OROMUCOSAL | Status: AC

## 2020-05-02 MED ORDER — IBUPROFEN 800 MG PO TABS: 800.0000 mg | ORAL_TABLET | Freq: Three times a day (TID) | ORAL | 1 refills | Status: DC

## 2020-05-02 MED ORDER — PROPOFOL 10 MG/ML IV BOLUS
INTRAVENOUS | Status: AC
  Filled 2020-05-02: qty 40

## 2020-05-02 MED ORDER — PROPOFOL 500 MG/50ML IV EMUL
INTRAVENOUS | Status: AC
  Filled 2020-05-02: qty 100

## 2020-05-02 MED ORDER — TRAMADOL HCL 50 MG PO TABS: 50.0000 mg | ORAL_TABLET | Freq: Four times a day (QID) | ORAL | 0 refills | Status: AC | PRN

## 2020-05-02 MED ORDER — CHLORHEXIDINE GLUCONATE 0.12 % MT SOLN: 15.0000 mL | Freq: Once | OROMUCOSAL | Status: AC

## 2020-05-02 MED ORDER — FENTANYL CITRATE (PF) 100 MCG/2ML IJ SOLN
INTRAMUSCULAR | Status: AC
  Filled 2020-05-02: qty 2

## 2020-05-02 MED ORDER — FENTANYL CITRATE (PF) 100 MCG/2ML IJ SOLN
INTRAMUSCULAR | Status: AC
  Administered 2020-05-02: 25 ug via INTRAVENOUS
  Filled 2020-05-02: qty 2

## 2020-05-02 MED ORDER — ONDANSETRON HCL 4 MG/2ML IJ SOLN
INTRAMUSCULAR | Status: AC
  Filled 2020-05-02: qty 2

## 2020-05-02 MED ORDER — FENTANYL CITRATE (PF) 100 MCG/2ML IJ SOLN
INTRAMUSCULAR | Status: DC | PRN
Start: 2020-05-02 — End: 2020-05-02
  Administered 2020-05-02: 50 ug via INTRAVENOUS
  Administered 2020-05-02 (×2): 25 ug via INTRAVENOUS

## 2020-05-02 MED ORDER — DEXMEDETOMIDINE (PRECEDEX) IN NS 20 MCG/5ML (4 MCG/ML) IV SYRINGE
PREFILLED_SYRINGE | INTRAVENOUS | Status: DC | PRN
  Administered 2020-05-02 (×2): 4 ug via INTRAVENOUS

## 2020-05-02 MED ORDER — FENTANYL CITRATE (PF) 100 MCG/2ML IJ SOLN
25.0000 ug | INTRAMUSCULAR | Status: DC | PRN
  Administered 2020-05-02: 25 ug via INTRAVENOUS

## 2020-05-02 MED ORDER — DEXAMETHASONE SODIUM PHOSPHATE 10 MG/ML IJ SOLN
INTRAMUSCULAR | Status: DC | PRN
  Administered 2020-05-02: 10 mg via INTRAVENOUS

## 2020-05-02 MED ORDER — DOCUSATE SODIUM 100 MG PO CAPS: 100.0000 mg | ORAL_CAPSULE | Freq: Two times a day (BID) | ORAL | 0 refills | Status: AC

## 2020-05-02 MED ORDER — MIDAZOLAM HCL 2 MG/2ML IJ SOLN
INTRAMUSCULAR | Status: AC
  Filled 2020-05-02: qty 2

## 2020-05-02 MED ORDER — LACTATED RINGERS IV SOLN: INTRAVENOUS | Status: DC

## 2020-05-02 MED ORDER — ONDANSETRON HCL 4 MG/2ML IJ SOLN: 4.0000 mg | Freq: Once | INTRAMUSCULAR | Status: DC | PRN

## 2020-05-02 MED ORDER — CHLORHEXIDINE GLUCONATE 0.12 % MT SOLN
OROMUCOSAL | Status: AC
  Administered 2020-05-02: 15 mL via OROMUCOSAL
  Filled 2020-05-02: qty 15

## 2020-05-02 MED ORDER — MIDAZOLAM HCL 2 MG/2ML IJ SOLN
INTRAMUSCULAR | Status: DC | PRN
  Administered 2020-05-02: 2 mg via INTRAVENOUS

## 2020-05-02 MED ORDER — PROPOFOL 10 MG/ML IV BOLUS
INTRAVENOUS | Status: DC | PRN
  Administered 2020-05-02: 170 mg via INTRAVENOUS
  Administered 2020-05-02: 150 ug/kg/min via INTRAVENOUS

## 2020-05-02 MED ORDER — LIDOCAINE HCL (CARDIAC) PF 100 MG/5ML IV SOSY
PREFILLED_SYRINGE | INTRAVENOUS | Status: DC | PRN
  Administered 2020-05-02: 100 mg via INTRAVENOUS

## 2020-05-02 MED ORDER — ONDANSETRON 4 MG PO TBDP: 4.0000 mg | ORAL_TABLET | Freq: Four times a day (QID) | ORAL | 0 refills | Status: AC | PRN

## 2020-05-02 MED ORDER — SEVOFLURANE IN SOLN
RESPIRATORY_TRACT | Status: AC
  Filled 2020-05-02: qty 250

## 2020-05-02 MED ORDER — LIDOCAINE HCL (PF) 2 % IJ SOLN
INTRAMUSCULAR | Status: AC
  Filled 2020-05-02: qty 5

## 2020-05-02 MED ORDER — ONDANSETRON HCL 4 MG/2ML IJ SOLN
INTRAMUSCULAR | Status: DC | PRN
  Administered 2020-05-02: 4 mg via INTRAVENOUS

## 2020-05-02 SURGICAL SUPPLY — 16 items
ABLATOR SURESOUND NOVASURE (ABLATOR) IMPLANT
CANISTER SUCT 1200ML W/VALVE (MISCELLANEOUS) ×2 IMPLANT
CATH ROBINSON RED A/P 16FR (CATHETERS) ×2 IMPLANT
COVER WAND RF STERILE (DRAPES) ×2 IMPLANT
GLOVE BIO SURGEON STRL SZ7 (GLOVE) ×2 IMPLANT
GLOVE INDICATOR 7.5 STRL GRN (GLOVE) ×2 IMPLANT
GOWN STRL REUS W/ TWL LRG LVL3 (GOWN DISPOSABLE) ×2 IMPLANT
GOWN STRL REUS W/TWL LRG LVL3 (GOWN DISPOSABLE) ×4
IV LACTATED RINGERS 1000ML (IV SOLUTION) ×2 IMPLANT
KIT TURNOVER CYSTO (KITS) ×2 IMPLANT
NS IRRIG 500ML POUR BTL (IV SOLUTION) ×2 IMPLANT
PACK DNC HYST (MISCELLANEOUS) ×2 IMPLANT
PAD OB MATERNITY 4.3X12.25 (PERSONAL CARE ITEMS) ×2 IMPLANT
PAD PREP 24X41 OB/GYN DISP (PERSONAL CARE ITEMS) ×2 IMPLANT
TOWEL OR 17X26 4PK STRL BLUE (TOWEL DISPOSABLE) ×2 IMPLANT
TUBING CONNECTING 10 (TUBING) ×2 IMPLANT

## 2020-05-02 NOTE — Discharge Instructions (Addendum)
Discharge instructions after a hysteroscopy with dilation and curettage  Signs and Symptoms to Report  Call our office at [phone] if you have any of the following:   . Fever over 100.4 degrees or higher . Severe stomach pain not relieved with pain medications . Bright red bleeding that's heavier than a period that does not slow with rest after the first 24 hours . To go the bathroom a lot (frequency), you can't hold your urine (urgency), or it hurts when you empty your bladder (urinate) . Chest pain . Shortness of breath . Pain in the calves of your legs . Severe nausea and vomiting not relieved with anti-nausea medications . Any concerns  What You Can Expect after Surgery . You may see some pink tinged, bloody fluid. This is normal. You may also have cramping for several days.   Activities after Your Discharge Follow these guidelines to help speed your recovery at home: . Don't drive if you are in pain or taking narcotic pain medicine. You may drive when you can safely slam on the brakes, turn the wheel forcefully, and rotate your torso comfortably. This is typically 4-7 days. Practice in a parking lot or side street prior to attempting to drive regularly.  . Ask others to help with household chores for 4 weeks. . Don't do strenuous activities, exercises, or sports like vacuuming, tennis, squash, etc. until your doctor says it is safe to do so. . Walk as you feel able. Rest often since it may take a week or two for your energy level to return to normal.  . You may climb stairs . Avoid constipation:   -Eat fruits, vegetables, and whole grains. Eat small meals as your appetite will take time to return to normal.   -Drink 6 to 8 glasses of water each day unless your doctor has told you to limit your fluids.   -Use a laxative or stool softener as needed if constipation becomes a problem. You may take Miralax, metamucil, Citrucil, Colace, Senekot, FiberCon, etc. If this does not  relieve the constipation, try two tablespoons of Milk Of Magnesia every 8 hours until your bowels move.  . You may shower.  . Do not get in a hot tub, swimming pool, etc. until your doctor agrees. . Do not douche, use tampons, or have sex until your doctor says it is okay, usually about 2 weeks. . Take your pain medicine when you need it. The medicine may not work as well if the pain is bad.  Take the medicines you were taking before surgery. Other medications you might need are pain medications (ibuprofen), medications for constipation (Colace) and nausea medications (Zofran).        AMBULATORY SURGERY  DISCHARGE INSTRUCTIONS   1) The drugs that you were given will stay in your system until tomorrow so for the next 24 hours you should not:  A) Drive an automobile B) Make any legal decisions C) Drink any alcoholic beverage   2) You may resume regular meals tomorrow.  Today it is better to start with liquids and gradually work up to solid foods.  You may eat anything you prefer, but it is better to start with liquids, then soup and crackers, and gradually work up to solid foods.   3) Please notify your doctor immediately if you have any unusual bleeding, trouble breathing, redness and pain at the surgery site, drainage, fever, or pain not relieved by medication.    4) Additional Instructions:          Please contact your physician with any problems or Same Day Surgery at [phone], Monday through Friday 6 am to 4 pm, or [facility] [place] Main number at [phone]. 

## 2020-05-02 NOTE — Interval H&P Note (Signed)
History and Physical Interval Note:  05/02/2020 9:24 AM  Kaitlyn Jordan  has presented today for surgery, with the diagnosis of abnormal uterine bleeding.  The various methods of treatment have been discussed with the patient and family. After consideration of risks, benefits and other options for treatment, the patient has consented to  Procedure(s): HYSTEROSCOPY WITH NOVASURE, DILATATION & CURETTAGE (N/A) as a surgical intervention.  The patient's history has been reviewed, patient examined, no change in status, stable for surgery.  I have reviewed the patient's chart and labs.  Questions were answered to the patient's satisfaction.     [name]

## 2020-05-02 NOTE — Anesthesia Procedure Notes (Signed)
Procedure Name: LMA Insertion Date/Time: 05/02/2020 9:44 AM Performed by: Manning Charity, CRNA Pre-anesthesia Checklist: Patient identified, Emergency Drugs available, Suction available and Patient being monitored Patient Re-evaluated:Patient Re-evaluated prior to induction Oxygen Delivery Method: Circle system utilized Preoxygenation: Pre-oxygenation with 100% oxygen Induction Type: IV induction Ventilation: Mask ventilation without difficulty LMA: LMA inserted LMA Size: 4.0 Number of attempts: 1 Placement Confirmation: positive ETCO2 and breath sounds checked- equal and bilateral Tube secured with: Tape Dental Injury: Teeth and Oropharynx as per pre-operative assessment

## 2020-05-02 NOTE — Transfer of Care (Signed)
Immediate Anesthesia Transfer of Care Note  Patient: Kaitlyn Jordan  Procedure(s) Performed: HYSTEROSCOPY, DILATATION & CURETTAGE (N/A )  Patient Location: PACU  Anesthesia Type:General  Level of Consciousness: awake, alert  and oriented  Airway & Oxygen Therapy: Patient Spontanous Breathing and Patient connected to face mask oxygen  Post-op Assessment: Report given to RN and Post -op Vital signs reviewed and stable  Post vital signs: Reviewed and stable  Last Vitals:  Vitals Value Taken Time  BP 114/74 05/02/20 1053  Temp    Pulse 68 05/02/20 1055  Resp 24 05/02/20 1055  SpO2 97 % 05/02/20 1055  Vitals shown include unvalidated device data.  Last Pain:  Vitals:   05/02/20 0858  TempSrc: Tympanic  PainSc: 0-No pain      Patients Stated Pain Goal: 0 (05/02/20 0858)  Complications: No complications documented.

## 2020-05-02 NOTE — Op Note (Signed)
Operative Report Hysteroscopy with Dilation and Curettage; Novasure ablation   Indications: Abnormal uterine bleeding   Pre-operative Diagnosis: Menometrorrhagia, failed medical management   Post-operative Diagnosis: same.  Procedure: 1. Exam under anesthesia 2. Fractional D&C with endocervical curettage  3. Hysteroscopy 4. Attempted Novasure endometrial ablation, procedure discontinued for uterine dimensions too small  Surgeon: Cline Cools, MD  Assistant(s):  None  Anesthesia: General LMA anesthesia  Anesthesiologist: Alver Fisher, MD Anesthesiologist: Alver Fisher, MD CRNA: Manning Charity, CRNA  Estimated Blood Loss:  Minimal         Intraoperative medications: none         Total IV Fluids:  Urine Output:         Specimens: Endocervical curettings, endometrial curettings         Complications:  Uterine width <2.0cm, device not deployed; patient tolerated the procedure well.         Disposition: PACU - hemodynamically stable.         Condition: stable  Findings: Uterus measuring 8 cm by sound; normal cervix, vagina, perineum. Minimally proliferative endometrium with firm bumps felt by curettage. +hemorrhoids  Cervical length: 3.5 cm Uterine cavity length: 4.5 cm Uterine cavity width: <2.0 cm -procedure discontinued   Indication for procedure/Consents: 49 y.o. [id number]  here for scheduled surgery for the aforementioned diagnoses. Risks of surgery were discussed with the patient including but not limited to: bleeding which may require transfusion; infection which may require antibiotics; injury to uterus or surrounding organs; intrauterine scarring which may impair future fertility; need for additional procedures including laparotomy or laparoscopy; and other postoperative/anesthesia complications. Written informed consent was obtained.    Procedure Details:   The patient was taken to the operating room where LMA anesthesia was administered  and was found to be adequate. After a formal and adequate timeout was performed, she was placed in the dorsal lithotomy position and examined with the above findings. She was then prepped and draped in the sterile manner. Her bladder was catheterized for an estimated amount of clear, yellow urine. A weighed speculum was then placed in the patient's vagina and a single tooth tenaculum was applied to the anterior lip of the cervix.  An endocervical currettage was performed. Her cervix was serially dilated to 15 [place] using Hanks dilators.The hysteroscope was introduced to reveal the above findings.The hysteroscope was also used to determine the level of the internal os, and measurements were confirmed. The uterine cavity was carefully examined, both ostia were recognized, and diffusely proliferative endometrium with polypoid fragments was noted.  A sharp curettage was then performed until there was a gritty texture in all four quadrants.   NOVASURE PROCEDURE DETAILS:   The cervix was further dilated to accommodate the NovaSure device.  The NovaSure device was inserted, and the cavity width was determined to be under the safe width to deploy power. No amount of maneuvering increased the measurement, and the procedure was discontinued.   The tenaculum was removed from the anterior lip of the cervix, and the vaginal speculum was removed after noting good hemostasis.  She received iv acetaminophen and Toradol prior to leaving the OR. The patient tolerated the procedure well and was taken to the recovery area awake, extubated and in stable condition.  The patient will be discharged to home as per PACU criteria.  Routine postoperative instructions given.  She was prescribed tramdol, Ibuprofen and Colace.  She will follow up in the clinic in two weeks for postoperative evaluation.

## 2020-05-02 NOTE — Anesthesia Preprocedure Evaluation (Signed)
Anesthesia Evaluation  Patient identified by MRN, date of birth, ID band Patient awake    Reviewed: Allergy & Precautions, NPO status , Patient's Chart, lab work & pertinent test results  History of Anesthesia Complications (+) PONV and history of anesthetic complications  Airway Mallampati: II  TM Distance: >3 FB Neck ROM: Full    Dental no notable dental hx.    Pulmonary neg sleep apnea, neg COPD, former smoker,    breath sounds clear to auscultation- rhonchi (-) wheezing      Cardiovascular Exercise Tolerance: Good (-) hypertension(-) CAD, (-) Past MI, (-) Cardiac Stents and (-) CABG  Rhythm:Regular Rate:Normal - Systolic murmurs and - Diastolic murmurs    Neuro/Psych neg Seizures negative neurological ROS  negative psych ROS   GI/Hepatic Neg liver ROS, GERD  ,  Endo/Other  neg diabetesHypothyroidism   Renal/GU negative Renal ROS     Musculoskeletal negative musculoskeletal ROS (+)   Abdominal (+) + obese,   Peds  Hematology negative hematology ROS (+)   Anesthesia Other Findings Past Medical History: No date: Complication of anesthesia No date: GERD (gastroesophageal reflux disease) No date: Hyperthyroidism No date: Hypothyroidism No date: PONV (postoperative nausea and vomiting) No date: UTI (urinary tract infection)   Reproductive/Obstetrics                             Anesthesia Physical Anesthesia Plan  ASA: II  Anesthesia Plan: General   Post-op Pain Management:    Induction: Intravenous  PONV Risk Score and Plan: 3 and Dexamethasone, Ondansetron, TIVA and Midazolam  Airway Management Planned: LMA  Additional Equipment:   Intra-op Plan:   Post-operative Plan:   Informed Consent: I have reviewed the patients History and Physical, chart, labs and discussed the procedure including the risks, benefits and alternatives for the proposed anesthesia with the patient or  authorized representative who has indicated his/her understanding and acceptance.     Dental advisory given  Plan Discussed with: CRNA and Anesthesiologist  Anesthesia Plan Comments:         Anesthesia Quick Evaluation

## 2020-05-02 NOTE — Anesthesia Postprocedure Evaluation (Signed)
Anesthesia Post Note  Patient: Kaitlyn Jordan  Procedure(s) Performed: HYSTEROSCOPY, DILATATION & CURETTAGE (N/A )  Patient location during evaluation: PACU Anesthesia Type: General Level of consciousness: awake and alert and oriented Pain management: pain level controlled Vital Signs Assessment: post-procedure vital signs reviewed and stable Respiratory status: spontaneous breathing, nonlabored ventilation and respiratory function stable Cardiovascular status: blood pressure returned to baseline and stable Postop Assessment: no signs of nausea or vomiting Anesthetic complications: no   No complications documented.   Last Vitals:  Vitals:   05/02/20 1214 05/02/20 1243  BP: 126/65 126/74  Pulse: 61 60  Resp: 16 16  Temp: 36.7 C   SpO2: 100% 99%    Last Pain:  Vitals:   05/02/20 1243  TempSrc:   PainSc: 1                  Ota Ebersole

## 2020-05-06 ENCOUNTER — Ambulatory Visit: Payer: BLUE CROSS/BLUE SHIELD

## 2020-05-06 LAB — SURGICAL PATHOLOGY

## 2020-05-08 ENCOUNTER — Other Ambulatory Visit (INDEPENDENT_AMBULATORY_CARE_PROVIDER_SITE_OTHER): Payer: BLUE CROSS/BLUE SHIELD | Admitting: Vascular Surgery

## 2020-05-12 ENCOUNTER — Encounter (INDEPENDENT_AMBULATORY_CARE_PROVIDER_SITE_OTHER): Payer: BLUE CROSS/BLUE SHIELD

## 2020-05-14 ENCOUNTER — Other Ambulatory Visit
Admission: RE | Admit: 2020-05-14 | Discharge: 2020-05-14 | Disposition: A | Discharge status: Home / Self Care | Payer: BLUE CROSS/BLUE SHIELD | Source: Ambulatory Visit | Attending: Podiatry | Admitting: Podiatry

## 2020-05-14 ENCOUNTER — Other Ambulatory Visit: Payer: Self-pay

## 2020-05-14 DIAGNOSIS — Z20822 Contact with and (suspected) exposure to covid-19: Secondary | ICD-10-CM | POA: Diagnosis not present

## 2020-05-14 DIAGNOSIS — Z01812 Encounter for preprocedural laboratory examination: Secondary | ICD-10-CM | POA: Diagnosis not present

## 2020-05-14 LAB — SARS CORONAVIRUS 2 (TAT 6-24 HRS): SARS Coronavirus 2: NEGATIVE

## 2020-05-15 ENCOUNTER — Other Ambulatory Visit: Payer: Self-pay | Admitting: Surgery

## 2020-05-16 ENCOUNTER — Other Ambulatory Visit: Payer: Self-pay

## 2020-05-16 ENCOUNTER — Encounter
Admission: RE | Disposition: A | Discharge status: Home / Self Care | Payer: Self-pay | Source: Home / Self Care | Attending: Podiatry

## 2020-05-16 ENCOUNTER — Ambulatory Visit: Payer: BLUE CROSS/BLUE SHIELD | Admitting: Urgent Care

## 2020-05-16 ENCOUNTER — Encounter: Payer: Self-pay | Admitting: Podiatry

## 2020-05-16 ENCOUNTER — Ambulatory Visit
Admission: RE | Admit: 2020-05-16 | Discharge: 2020-05-16 | Disposition: A | Discharge status: Home / Self Care | Payer: BLUE CROSS/BLUE SHIELD | Attending: Podiatry | Admitting: Podiatry

## 2020-05-16 DIAGNOSIS — K219 Gastro-esophageal reflux disease without esophagitis: Secondary | ICD-10-CM | POA: Diagnosis not present

## 2020-05-16 DIAGNOSIS — Z91030 Bee allergy status: Secondary | ICD-10-CM | POA: Diagnosis not present

## 2020-05-16 DIAGNOSIS — Z79899 Other long term (current) drug therapy: Secondary | ICD-10-CM | POA: Insufficient documentation

## 2020-05-16 DIAGNOSIS — E039 Hypothyroidism, unspecified: Secondary | ICD-10-CM | POA: Insufficient documentation

## 2020-05-16 DIAGNOSIS — Z79890 Hormone replacement therapy (postmenopausal): Secondary | ICD-10-CM | POA: Diagnosis not present

## 2020-05-16 DIAGNOSIS — E0590 Thyrotoxicosis, unspecified without thyrotoxic crisis or storm: Secondary | ICD-10-CM | POA: Diagnosis not present

## 2020-05-16 DIAGNOSIS — Z87891 Personal history of nicotine dependence: Secondary | ICD-10-CM | POA: Diagnosis not present

## 2020-05-16 DIAGNOSIS — M722 Plantar fascial fibromatosis: Secondary | ICD-10-CM | POA: Diagnosis present

## 2020-05-16 HISTORY — PX: PLANTAR FASCIA RELEASE: SHX2239

## 2020-05-16 LAB — POCT PREGNANCY, URINE: Preg Test, Ur: NEGATIVE

## 2020-05-16 SURGERY — RELEASE, FASCIA, PLANTAR
Anesthesia: General | Site: Foot | Laterality: Right

## 2020-05-16 MED ORDER — MIDAZOLAM HCL 2 MG/2ML IJ SOLN
INTRAMUSCULAR | Status: AC
  Filled 2020-05-16: qty 2

## 2020-05-16 MED ORDER — PROPOFOL 10 MG/ML IV BOLUS
INTRAVENOUS | Status: AC
  Filled 2020-05-16: qty 20

## 2020-05-16 MED ORDER — LACTATED RINGERS IV SOLN: INTRAVENOUS | Status: DC

## 2020-05-16 MED ORDER — EPHEDRINE SULFATE 50 MG/ML IJ SOLN
INTRAMUSCULAR | Status: DC | PRN
  Administered 2020-05-16: 5 mg via INTRAVENOUS

## 2020-05-16 MED ORDER — FENTANYL CITRATE (PF) 100 MCG/2ML IJ SOLN
INTRAMUSCULAR | Status: AC
  Filled 2020-05-16: qty 2

## 2020-05-16 MED ORDER — PROPOFOL 500 MG/50ML IV EMUL
INTRAVENOUS | Status: DC | PRN
  Administered 2020-05-16: 120 ug/kg/min via INTRAVENOUS
  Administered 2020-05-16: 175 ug/kg/min via INTRAVENOUS
  Administered 2020-05-16: 150 ug/kg/min via INTRAVENOUS

## 2020-05-16 MED ORDER — SODIUM CHLORIDE (PF) 0.9 % IJ SOLN
INTRAMUSCULAR | Status: AC
  Filled 2020-05-16: qty 10

## 2020-05-16 MED ORDER — BUPIVACAINE LIPOSOME 1.3 % IJ SUSP
INTRAMUSCULAR | Status: DC | PRN
  Administered 2020-05-16: 18 mL

## 2020-05-16 MED ORDER — PROPOFOL 10 MG/ML IV BOLUS
INTRAVENOUS | Status: DC | PRN
  Administered 2020-05-16: 50 mg via INTRAVENOUS
  Administered 2020-05-16: 150 mg via INTRAVENOUS
  Administered 2020-05-16: 50 mg via INTRAVENOUS

## 2020-05-16 MED ORDER — CHLORHEXIDINE GLUCONATE 0.12 % MT SOLN: 15.0000 mL | Freq: Once | OROMUCOSAL | Status: AC

## 2020-05-16 MED ORDER — LIDOCAINE HCL (PF) 2 % IJ SOLN
INTRAMUSCULAR | Status: AC
  Filled 2020-05-16: qty 5

## 2020-05-16 MED ORDER — PROPOFOL 500 MG/50ML IV EMUL
INTRAVENOUS | Status: AC
  Filled 2020-05-16: qty 50

## 2020-05-16 MED ORDER — KETOROLAC TROMETHAMINE 30 MG/ML IJ SOLN
INTRAMUSCULAR | Status: DC | PRN
  Administered 2020-05-16: 30 mg via INTRAVENOUS

## 2020-05-16 MED ORDER — DEXMEDETOMIDINE (PRECEDEX) IN NS 20 MCG/5ML (4 MCG/ML) IV SYRINGE
PREFILLED_SYRINGE | INTRAVENOUS | Status: AC
  Filled 2020-05-16: qty 5

## 2020-05-16 MED ORDER — OXYCODONE HCL 5 MG/5ML PO SOLN: 5.0000 mg | Freq: Once | ORAL | Status: DC | PRN

## 2020-05-16 MED ORDER — CHLORHEXIDINE GLUCONATE 0.12 % MT SOLN
OROMUCOSAL | Status: AC
  Administered 2020-05-16: 15 mL via OROMUCOSAL
  Filled 2020-05-16: qty 15

## 2020-05-16 MED ORDER — ROCURONIUM BROMIDE 10 MG/ML (PF) SYRINGE
PREFILLED_SYRINGE | INTRAVENOUS | Status: AC
  Filled 2020-05-16: qty 10

## 2020-05-16 MED ORDER — OXYCODONE HCL 5 MG PO TABS: 5.0000 mg | ORAL_TABLET | Freq: Once | ORAL | Status: DC | PRN

## 2020-05-16 MED ORDER — FENTANYL CITRATE (PF) 100 MCG/2ML IJ SOLN
INTRAMUSCULAR | Status: DC | PRN
Start: 2020-05-16 — End: 2020-05-16
  Administered 2020-05-16 (×3): 25 ug via INTRAVENOUS

## 2020-05-16 MED ORDER — ONDANSETRON HCL 4 MG/2ML IJ SOLN
INTRAMUSCULAR | Status: AC
  Filled 2020-05-16: qty 2

## 2020-05-16 MED ORDER — ACETAMINOPHEN 10 MG/ML IV SOLN
INTRAVENOUS | Status: AC
  Filled 2020-05-16: qty 100

## 2020-05-16 MED ORDER — CEFAZOLIN SODIUM-DEXTROSE 2-4 GM/100ML-% IV SOLN
2.0000 g | INTRAVENOUS | Status: AC
  Administered 2020-05-16: 2 g via INTRAVENOUS

## 2020-05-16 MED ORDER — LIDOCAINE HCL (PF) 1 % IJ SOLN
INTRAMUSCULAR | Status: AC
  Filled 2020-05-16: qty 30

## 2020-05-16 MED ORDER — BUPIVACAINE HCL (PF) 0.5 % IJ SOLN
INTRAMUSCULAR | Status: DC | PRN
  Administered 2020-05-16: 15 mL

## 2020-05-16 MED ORDER — DEXAMETHASONE SODIUM PHOSPHATE 10 MG/ML IJ SOLN
INTRAMUSCULAR | Status: AC
  Filled 2020-05-16: qty 1

## 2020-05-16 MED ORDER — CEFAZOLIN SODIUM-DEXTROSE 2-4 GM/100ML-% IV SOLN
INTRAVENOUS | Status: AC
  Filled 2020-05-16: qty 100

## 2020-05-16 MED ORDER — FENTANYL CITRATE (PF) 100 MCG/2ML IJ SOLN: 25.0000 ug | INTRAMUSCULAR | Status: DC | PRN

## 2020-05-16 MED ORDER — BUPIVACAINE HCL (PF) 0.5 % IJ SOLN
INTRAMUSCULAR | Status: AC
  Filled 2020-05-16: qty 30

## 2020-05-16 MED ORDER — PHENYLEPHRINE HCL (PRESSORS) 10 MG/ML IV SOLN
INTRAVENOUS | Status: DC | PRN
  Administered 2020-05-16: 100 ug via INTRAVENOUS
  Administered 2020-05-16: 200 ug via INTRAVENOUS
  Administered 2020-05-16 (×6): 100 ug via INTRAVENOUS

## 2020-05-16 MED ORDER — ONDANSETRON HCL 4 MG PO TABS: 4.0000 mg | ORAL_TABLET | Freq: Four times a day (QID) | ORAL | Status: DC | PRN

## 2020-05-16 MED ORDER — MEPERIDINE HCL 50 MG/ML IJ SOLN: 6.2500 mg | INTRAMUSCULAR | Status: DC | PRN

## 2020-05-16 MED ORDER — LIDOCAINE HCL (CARDIAC) PF 100 MG/5ML IV SOSY
PREFILLED_SYRINGE | INTRAVENOUS | Status: DC | PRN
  Administered 2020-05-16: 100 mg via INTRAVENOUS

## 2020-05-16 MED ORDER — DEXAMETHASONE SODIUM PHOSPHATE 10 MG/ML IJ SOLN
INTRAMUSCULAR | Status: DC | PRN
  Administered 2020-05-16: 8 mg via INTRAVENOUS

## 2020-05-16 MED ORDER — ORAL CARE MOUTH RINSE: 15.0000 mL | Freq: Once | OROMUCOSAL | Status: AC

## 2020-05-16 MED ORDER — HYDROCODONE-ACETAMINOPHEN 5-325 MG PO TABS: 1.0000 | ORAL_TABLET | Freq: Four times a day (QID) | ORAL | 0 refills | Status: DC | PRN

## 2020-05-16 MED ORDER — KETAMINE HCL 50 MG/ML IJ SOLN
INTRAMUSCULAR | Status: AC
  Filled 2020-05-16: qty 10

## 2020-05-16 MED ORDER — ONDANSETRON HCL 4 MG/2ML IJ SOLN
INTRAMUSCULAR | Status: DC | PRN
  Administered 2020-05-16: 4 mg via INTRAVENOUS

## 2020-05-16 MED ORDER — ONDANSETRON HCL 4 MG/2ML IJ SOLN: 4.0000 mg | Freq: Four times a day (QID) | INTRAMUSCULAR | Status: DC | PRN

## 2020-05-16 MED ORDER — BUPIVACAINE LIPOSOME 1.3 % IJ SUSP
INTRAMUSCULAR | Status: AC
  Filled 2020-05-16: qty 20

## 2020-05-16 MED ORDER — KETAMINE HCL 10 MG/ML IJ SOLN
INTRAMUSCULAR | Status: DC | PRN
  Administered 2020-05-16: 20 mg via INTRAVENOUS

## 2020-05-16 MED ORDER — PROMETHAZINE HCL 25 MG/ML IJ SOLN: 6.2500 mg | INTRAMUSCULAR | Status: DC | PRN

## 2020-05-16 MED ORDER — ACETAMINOPHEN 10 MG/ML IV SOLN
INTRAVENOUS | Status: DC | PRN
  Administered 2020-05-16: 1000 mg via INTRAVENOUS

## 2020-05-16 MED ORDER — MIDAZOLAM HCL 2 MG/2ML IJ SOLN
INTRAMUSCULAR | Status: DC | PRN
  Administered 2020-05-16: 2 mg via INTRAVENOUS

## 2020-05-16 MED ORDER — DEXMEDETOMIDINE HCL 200 MCG/2ML IV SOLN
INTRAVENOUS | Status: DC | PRN
  Administered 2020-05-16 (×2): 8 ug via INTRAVENOUS
  Administered 2020-05-16: 12 ug via INTRAVENOUS

## 2020-05-16 MED ORDER — POVIDONE-IODINE 7.5 % EX SOLN
Freq: Once | CUTANEOUS | Status: DC
  Filled 2020-05-16: qty 118

## 2020-05-16 SURGICAL SUPPLY — 38 items
BNDG CMPR STD VLCR NS LF 5.8X4 (GAUZE/BANDAGES/DRESSINGS) ×1
BNDG CONFORM 2 STRL LF (GAUZE/BANDAGES/DRESSINGS) ×2 IMPLANT
BNDG CONFORM 3 STRL LF (GAUZE/BANDAGES/DRESSINGS) ×2 IMPLANT
BNDG ELASTIC 4X5.8 VLCR NS LF (GAUZE/BANDAGES/DRESSINGS) ×2 IMPLANT
BNDG ESMARK 4X12 TAN STRL LF (GAUZE/BANDAGES/DRESSINGS) ×2 IMPLANT
BNDG GAUZE 4.5X4.1 6PLY STRL (MISCELLANEOUS) ×2 IMPLANT
BOOT STEPPER DURA MED (SOFTGOODS) ×2 IMPLANT
CANISTER SUCT 1200ML W/VALVE (MISCELLANEOUS) ×2 IMPLANT
COVER WAND RF STERILE (DRAPES) ×2 IMPLANT
CUFF TOURN SGL QUICK 18X4 (TOURNIQUET CUFF) IMPLANT
DURAPREP 26ML APPLICATOR (WOUND CARE) ×2 IMPLANT
ELECT REM PT RETURN 9FT ADLT (ELECTROSURGICAL) ×2
ELECTRODE REM PT RTRN 9FT ADLT (ELECTROSURGICAL) ×1 IMPLANT
GAUZE SPONGE 4X4 12PLY STRL (GAUZE/BANDAGES/DRESSINGS) ×2 IMPLANT
GAUZE XEROFORM 1X8 LF (GAUZE/BANDAGES/DRESSINGS) ×2 IMPLANT
GLOVE BIO SURGEON STRL SZ7.5 (GLOVE) ×2 IMPLANT
GLOVE INDICATOR 8.0 STRL GRN (GLOVE) ×2 IMPLANT
GOWN STRL REUS W/ TWL XL LVL3 (GOWN DISPOSABLE) ×2 IMPLANT
GOWN STRL REUS W/TWL XL LVL3 (GOWN DISPOSABLE) ×4
IV NS 250ML (IV SOLUTION) ×2
IV NS 250ML BAXH (IV SOLUTION) ×1 IMPLANT
KIT CARPAL TUNNEL (MISCELLANEOUS) ×2
KIT PRC PRB RTRGD 3ANG KNF HND (MISCELLANEOUS) ×1 IMPLANT
LABEL OR SOLS (LABEL) ×2 IMPLANT
LOOP VESSEL MINI 0.8X406 BLUE (MISCELLANEOUS) ×2 IMPLANT
LOOPS BLUE MINI 0.8X406MM (MISCELLANEOUS) ×2
NEEDLE HYPO 25X1 1.5 SAFETY (NEEDLE) ×2 IMPLANT
NS IRRIG 500ML POUR BTL (IV SOLUTION) ×2 IMPLANT
PACK EXTREMITY (MISCELLANEOUS) ×2 IMPLANT
PAD PREP 24X41 OB/GYN DISP (PERSONAL CARE ITEMS) ×2 IMPLANT
PENCIL ELECTRO HAND CTR (MISCELLANEOUS) ×2 IMPLANT
STOCKINETTE M/LG 89821 (MISCELLANEOUS) ×2 IMPLANT
STRAP SAFETY 5IN WIDE (MISCELLANEOUS) ×2 IMPLANT
SUT VIC AB 4-0 SH 27 (SUTURE) ×2
SUT VIC AB 4-0 SH 27XANBCTRL (SUTURE) ×1 IMPLANT
SYR 10ML LL (SYRINGE) ×2 IMPLANT
WAND TOPAZ MICRO DEBRIDER (MISCELLANEOUS) ×2 IMPLANT
WIRE Z .062 C-WIRE SPADE TIP (WIRE) ×2 IMPLANT

## 2020-05-16 NOTE — Transfer of Care (Signed)
Immediate Anesthesia Transfer of Care Note  Patient: Kaitlyn Jordan  Procedure(s) Performed: ENDOSCOPIC PLANTAR FASCIOTOMY RELEASE RIGHT HEEL (Right Foot)  Patient Location: PACU  Anesthesia Type:General  Level of Consciousness: drowsy  Airway & Oxygen Therapy: Patient Spontanous Breathing and Patient connected to face mask oxygen  Post-op Assessment: Report given to RN and Post -op Vital signs reviewed and stable  Post vital signs: Reviewed and stable  Last Vitals:  Vitals Value Taken Time  BP 115/74   Temp 36.1 C   Pulse 67   Resp 16   SpO2 95%      Last Pain:  Vitals:   05/16/20 0622  TempSrc: Temporal         Complications: No complications documented.

## 2020-05-16 NOTE — OR Nursing (Signed)
Urine pregnancy negative per am urine POC

## 2020-05-16 NOTE — Anesthesia Postprocedure Evaluation (Signed)
Anesthesia Post Note  Patient: Kaitlyn Jordan  Procedure(s) Performed: ENDOSCOPIC PLANTAR FASCIOTOMY RELEASE RIGHT HEEL (Right Foot)  Patient location during evaluation: PACU Anesthesia Type: General Level of consciousness: awake and alert and oriented Pain management: pain level controlled Vital Signs Assessment: post-procedure vital signs reviewed and stable Respiratory status: spontaneous breathing, nonlabored ventilation and respiratory function stable Cardiovascular status: blood pressure returned to baseline and stable Postop Assessment: no signs of nausea or vomiting Anesthetic complications: no   No complications documented.   Last Vitals:  Vitals:   05/16/20 0940 05/16/20 0957  BP: 121/84 120/72  Pulse: 63 (!) 55  Resp: 13 14  Temp:  (!) 36.2 C  SpO2: 100% 100%    Last Pain:  Vitals:   05/16/20 0957  TempSrc: Temporal  PainSc: 0-No pain                 Shianne Zeiser

## 2020-05-16 NOTE — Anesthesia Procedure Notes (Signed)
Procedure Name: LMA Insertion Date/Time: 05/16/2020 7:47 AM Performed by: Lynden Oxford, CRNA Pre-anesthesia Checklist: Patient identified, Emergency Drugs available, Suction available and Patient being monitored Patient Re-evaluated:Patient Re-evaluated prior to induction Oxygen Delivery Method: Circle system utilized Preoxygenation: Pre-oxygenation with 100% oxygen Induction Type: IV induction Ventilation: Mask ventilation without difficulty LMA: LMA inserted LMA Size: 4.0 Tube type: Oral Number of attempts: 1 Airway Equipment and Method: Oral airway and Patient positioned with wedge pillow Placement Confirmation: positive ETCO2 and breath sounds checked- equal and bilateral Tube secured with: Tape Dental Injury: Teeth and Oropharynx as per pre-operative assessment

## 2020-05-16 NOTE — Anesthesia Preprocedure Evaluation (Signed)
Anesthesia Evaluation  Patient identified by MRN, date of birth, ID band Patient awake    Reviewed: Allergy & Precautions, NPO status , Patient's Chart, lab work & pertinent test results  History of Anesthesia Complications (+) PONV and history of anesthetic complications  Airway Mallampati: II  TM Distance: >3 FB Neck ROM: Full    Dental no notable dental hx.    Pulmonary neg sleep apnea, neg COPD, former smoker,    breath sounds clear to auscultation- rhonchi (-) wheezing      Cardiovascular Exercise Tolerance: Good (-) hypertension(-) CAD, (-) Past MI, (-) Cardiac Stents and (-) CABG  Rhythm:Regular Rate:Normal - Systolic murmurs and - Diastolic murmurs    Neuro/Psych neg Seizures negative neurological ROS  negative psych ROS   GI/Hepatic Neg liver ROS, GERD  ,  Endo/Other  neg diabetesHypothyroidism   Renal/GU negative Renal ROS     Musculoskeletal negative musculoskeletal ROS (+)   Abdominal (+) + obese,   Peds  Hematology negative hematology ROS (+)   Anesthesia Other Findings Past Medical History: No date: Complication of anesthesia No date: GERD (gastroesophageal reflux disease) No date: Hyperthyroidism No date: Hypothyroidism No date: PONV (postoperative nausea and vomiting) No date: UTI (urinary tract infection)   Reproductive/Obstetrics                             Anesthesia Physical Anesthesia Plan  ASA: II  Anesthesia Plan: General   Post-op Pain Management:    Induction: Intravenous  PONV Risk Score and Plan: 3 and TIVA, Ondansetron, Dexamethasone, Midazolam and Propofol infusion  Airway Management Planned: Natural Airway  Additional Equipment:   Intra-op Plan:   Post-operative Plan:   Informed Consent: I have reviewed the patients History and Physical, chart, labs and discussed the procedure including the risks, benefits and alternatives for the proposed  anesthesia with the patient or authorized representative who has indicated his/her understanding and acceptance.     Dental advisory given  Plan Discussed with: CRNA and Anesthesiologist  Anesthesia Plan Comments:         Anesthesia Quick Evaluation

## 2020-05-16 NOTE — Discharge Instructions (Addendum)
[facility]  POST OPERATIVE INSTRUCTIONS FOR DR. TROXLER, DR. Ether Griffins, AND [facility]   1. Take your medication as prescribed.  Pain medication should be taken only as needed.  2. Keep the dressing clean, dry and intact.  3. Keep your foot elevated above the heart level for the first 48 hours.  4. We have instructed you to be non-weight bearing.  5. Always wear your post-op shoe when walking.  Always use your crutches if you are to be non-weight bearing.  6. Every hour you are awake:  - Bend your knee 15 times.  7. Call [facility] [phone]) if any of the following problems occur: - You develop a temperature or fever. - The bandage becomes saturated with blood. - Medication does not stop your pain. - Injury of the foot occurs. - Any symptoms of infection including redness, odor, or red streaks running from wound.   Bupivacaine Liposomal Suspension for Injection What is this medicine? BUPIVACAINE LIPOSOMAL (bue PIV a kane LIP oh som al) is an anesthetic. It causes loss of feeling in the skin or other tissues. It is used to prevent and to treat pain from some procedures. This medicine may be used for other purposes; ask your health care provider or pharmacist if you have questions. COMMON BRAND NAME(S): EXPAREL What should I tell my health care provider before I take this medicine? They need to know if you have any of these conditions:  G6PD deficiency  heart disease  kidney disease  liver disease  low blood pressure  lung or breathing disease, like asthma  an unusual or allergic reaction to bupivacaine, other medicines, foods, dyes, or preservatives  pregnant or trying to get pregnant  breast-feeding How should I use this medicine? This medicine is for injection into the affected area. It is given by a health care professional in a hospital or clinic setting. Talk to your  pediatrician regarding the use of this medicine in children. Special care may be needed. Overdosage: If you think you have taken too much of this medicine contact a poison control center or emergency room at once. NOTE: This medicine is only for you. Do not share this medicine with others. What if I miss a dose? This does not apply. What may interact with this medicine? This medicine may interact with the following medications:  acetaminophen  certain antibiotics like dapsone, nitrofurantoin, aminosalicylic acid, sulfonamides  certain medicines for seizures like phenobarbital, phenytoin, valproic acid  chloroquine  cyclophosphamide  flutamide  hydroxyurea  ifosfamide  metoclopramide  nitric oxide  nitroglycerin  nitroprusside  nitrous oxide  other local anesthetics like lidocaine, pramoxine, tetracaine  primaquine  quinine  rasburicase  sulfasalazine This list may not describe all possible interactions. Give your health care provider a list of all the medicines, herbs, non-prescription drugs, or dietary supplements you use. Also tell them if you smoke, drink alcohol, or use illegal drugs. Some items may interact with your medicine. What should I watch for while using this medicine? Your condition will be monitored carefully while you are receiving this medicine. Be careful to avoid injury while the area is numb, and you are not aware of pain. What side effects may I notice from receiving this medicine? Side effects that you should report to your doctor or health care professional as soon as possible:  allergic reactions like skin rash, itching or hives, swelling of the face, lips, or tongue  seizures  signs and symptoms of a dangerous change in heartbeat or heart rhythm like chest pain; dizziness; fast, irregular heartbeat; palpitations; feeling faint or lightheaded; falls; breathing problems  signs and symptoms of methemoglobinemia such as pale, gray, or blue  colored skin; headache; fast heartbeat; shortness of breath; feeling faint or lightheaded, falls; tiredness Side effects that usually do not require medical attention (report to your doctor or health care professional if they continue or are bothersome):  anxious  back pain  changes in taste  changes in vision  constipation  dizziness  fever  nausea, vomiting This list may not describe all possible side effects. Call your doctor for medical advice about side effects. You may report side effects to FDA at 1-800-FDA-1088. Where should I keep my medicine? This drug is given in a hospital or clinic and will not be stored at home. NOTE: This sheet is a summary. It may not cover all possible information. If you have questions about this medicine, talk to your doctor, pharmacist, or health care provider.  2020 Elsevier/Gold Standard (2019-05-29 10:48:23)   AMBULATORY SURGERY  DISCHARGE INSTRUCTIONS   1) The drugs that you were given will stay in your system until tomorrow so for the next 24 hours you should not:  A) Drive an automobile B) Make any legal decisions C) Drink any alcoholic beverage   2) You may resume regular meals tomorrow.  Today it is better to start with liquids and gradually work up to solid foods.  You may eat anything you prefer, but it is better to start with liquids, then soup and crackers, and gradually work up to solid foods.   3) Please notify your doctor immediately if you have any unusual bleeding, trouble breathing, redness and pain at the surgery site, drainage, fever, or pain not relieved by medication.    4) Additional Instructions:        Please contact your physician with any problems or Same Day Surgery at [phone], Monday through Friday 6 am to 4 pm, or  at [facility] number at [phone].

## 2020-05-16 NOTE — Op Note (Signed)
Operative note   Surgeon:Labella Zahradnik [employer] logistics/support/administrative officer: None    Preop diagnosis: Plantar fasciitis right heel    Postop diagnosis: Same    Procedure: Endoscopic plantar fasciotomy with Topaz microdebrider    EBL: Minimal    Anesthesia:local and general    Hemostasis: Mid calf tourniquet inflated to 200 mmHg for approximately 25 minutes    Specimen: None    Complications: None    Operative indications:Kaitlyn Jordan is an 49 y.o. that presents today for surgical intervention.  The risks/benefits/alternatives/complications have been discussed and consent has been given.    Procedure:  Patient was brought into the OR and placed on the operating table in thesupine position. After anesthesia was obtained theright lower extremity was prepped and draped in usual sterile fashion.  Attention was directed to the medial aspect of the heel where at approximately 2 cm from plantar and 5 cm from posterior of the heel a small stab incision was made.  Blunt dissection was carried down to the plantar fascial.  The fascial elevator was used.  The blunt trocar and cannula was introduced from medial to lateral.  A small stab incision was made laterally and the blunt trocar was then removed.  The plantar fascial ligament was then evaluated with the small scope.  At this time with a small triangle blade the medial two thirds of the plantar fascial ligament was released.  The muscle belly was noted deep to the ligament area.  Further evaluation did not reveal any retained ligament.  The wound was then flushed.  The instrumentation was removed.  The incisions were closed with a 3-0 nylon.  Next small percutaneous holes were made in a gridlike pattern at the plantar fascial insertion on the plantar medial heel with a 0.062 K wire.  A Topaz wand was then introduced into all sites.  This was used to microdebrider the ligament for further healing processes.  At this time a bulky sterile dressing was then applied to the  left foot.  The patient was placed at a 90 degree neutral position in an equalizer walker boot.  Exparel long-acting anesthetic was used at the end of the case and a combination of 0.5% bupivacaine and Exparel was used initially at the beginning of the case.    Patient tolerated the procedure and anesthesia well.  Was transported from the OR to the PACU with all vital signs stable and vascular status intact. To be discharged per routine protocol.  Will follow up in approximately 1 week in the outpatient clinic.

## 2020-05-16 NOTE — Progress Notes (Addendum)
   05/16/20 0745  Clinical Encounter Type  Visited With Family  Visit Type Initial  Referral From Chaplain  Consult/Referral To Chaplain  While rounding [facility] waiting area, chaplain briefly visited with pt's mother, checking to see how she was doing while waiting for her daughter. Pt's mother said she was fine and chaplain wished her well and left.

## 2020-05-16 NOTE — H&P (Signed)
HISTORY AND PHYSICAL INTERVAL NOTE:  05/16/2020  7:29 AM  Kaitlyn Jordan  has presented today for surgery, with the diagnosis of M72.2  PLANTAR FASCIITIS.  The various methods of treatment have been discussed with the patient.  No guarantees were given.  After consideration of risks, benefits and other options for treatment, the patient has consented to surgery.  I have reviewed the patients' chart and labs.     A history and physical examination was performed in my office.  The patient was reexamined.  There have been no changes to this history and physical examination.  Gwyneth Revels A

## 2020-06-16 ENCOUNTER — Other Ambulatory Visit: Payer: Self-pay

## 2020-06-16 ENCOUNTER — Encounter: Payer: Self-pay | Admitting: *Deleted

## 2020-06-16 ENCOUNTER — Encounter
Admit: 2020-06-16 | Discharge: 2020-06-16 | Disposition: A | Discharge status: Home / Self Care | Payer: BLUE CROSS/BLUE SHIELD | Attending: Surgery | Admitting: Surgery

## 2020-06-16 NOTE — Patient Instructions (Addendum)
Your procedure is scheduled on:06-25-20 [facility] Report to Day Surgery on the 2nd floor of the Medical Mall. To find out your arrival time, please call [phone] between 1PM - 3PM on:06-24-20 TUESDAY  REMEMBER: Instructions that are not followed completely may result in serious medical risk, up to and including death; or upon the discretion of your surgeon and anesthesiologist your surgery may need to be rescheduled.  Do not eat food after midnight the night before surgery.  No gum chewing, lozengers or hard candies.  You may however, drink CLEAR liquids up to 2 hours before you are scheduled to arrive for your surgery. Do not drink anything within 2 hours of your scheduled arrival time.  Clear liquids include: - water  - apple juice without pulp - gatorade (not RED, PURPLE, OR BLUE) - black coffee or tea (Do NOT add milk or creamers to the coffee or tea) Do NOT drink anything that is not on this list.  In addition, your doctor has ordered for you to drink the provided  Ensure Pre-Surgery Clear Carbohydrate Drink  Drinking this carbohydrate drink up to two hours before surgery helps to reduce insulin resistance and improve patient outcomes. Please complete drinking 2 hours prior to scheduled arrival time- (IF YOU HAVE TO ARRIVE TO HOSPITAL AT 6 AM, HAVE ENSURE FINISHED BY 4:30 AM)   TAKE THESE MEDICATIONS THE MORNING OF SURGERY WITH A SIP OF WATER: -EUTHYROX  -ZYRTEC (CETIRIZINE) -PRILOSEC (OMEPRAZOLE)-take one the night before and one on the morning of surgery - helps to prevent nausea after surgery.  One week prior to surgery: Stop Anti-inflammatories (NSAIDS) such as Advil, Aleve, Ibuprofen, Motrin, Naproxen, Naprosyn and Aspirin based products such as Excedrin, Goodys Powder, BC Powder-OK TO TAKE TYLENOL IF NEEDED  Stop ANY OVER THE COUNTER supplements until after surgery-STOP YOUR TURMERIC 7 DAYS PRIOR TO SURGERY-OK TO RESUME AFTER SURGERY (You may continue taking Vitamin  D, B 12 and Magnesium.)  No Alcohol for 24 hours before or after surgery.  No Smoking including e-cigarettes for 24 hours prior to surgery.  No chewable tobacco products for at least 6 hours prior to surgery.  No nicotine patches on the day of surgery.  Do not use any "recreational" drugs for at least a week prior to your surgery.  Please be advised that the combination of cocaine and anesthesia may have negative outcomes, up to and including death. If you test positive for cocaine, your surgery will be cancelled.  On the morning of surgery brush your teeth with toothpaste and water, you may rinse your mouth with mouthwash if you wish. Do not swallow any toothpaste or mouthwash.  Do not wear jewelry, make-up, hairpins, clips or nail polish.  Do not wear lotions, powders, or perfumes.   Do not shave 48 hours prior to surgery.   Contact lenses, hearing aids and dentures may not be worn into surgery.  Do not bring valuables to the hospital. [facility] is not responsible for any missing/lost belongings or valuables.   Use CHG Soap as directed on instruction sheet..   Notify your doctor if there is any change in your medical condition (cold, fever, infection).  Wear comfortable clothing (specific to your surgery type) to the hospital.  Plan for stool softeners for home use; pain medications have a tendency to cause constipation. You can also help prevent constipation by eating foods high in fiber such as fruits and vegetables and drinking plenty of fluids as your diet allows.  After surgery, you  can help prevent lung complications by doing breathing exercises.  Take deep breaths and cough every 1-2 hours. Your doctor may order a device called an Incentive Spirometer to help you take deep breaths. When coughing or sneezing, hold a pillow firmly against your incision with both hands. This is called "splinting." Doing this helps protect your incision. It also decreases belly  discomfort.  If you are being admitted to the hospital overnight, leave your suitcase in the car. After surgery it may be brought to your room.  If you are being discharged the day of surgery, you will not be allowed to drive home. You will need a responsible adult (18 years or older) to drive you home and stay with you that night.   If you are taking public transportation, you will need to have a responsible adult (18 years or older) with you. Please confirm with your physician that it is acceptable to use public transportation.   Please call the Pre-admissions Testing Dept. at [phone] if you have any questions about these instructions.  Visitation Policy:  Patients undergoing a surgery or procedure may have one family member or support person with them as long as that person is not COVID-19 positive or experiencing its symptoms.  That person may remain in the waiting area during the procedure.  Inpatient Visitation Update:   In an effort to ensure the safety of our team members and our patients, we are implementing a change to our visitation policy:  Effective Monday, Aug. 9, at 7 a.m., inpatients will be allowed one support person.  o The support person may change daily.  o The support person must pass our screening, gel in and out, and wear a mask at all times, including in the patient's room.  o Patients must also wear a mask when staff or their support person are in the room.  o Masking is required regardless of vaccination status.  Systemwide, no visitors 17 or younger.

## 2020-06-19 ENCOUNTER — Encounter (INDEPENDENT_AMBULATORY_CARE_PROVIDER_SITE_OTHER): Payer: Self-pay | Admitting: Vascular Surgery

## 2020-06-19 ENCOUNTER — Ambulatory Visit (INDEPENDENT_AMBULATORY_CARE_PROVIDER_SITE_OTHER): Payer: BLUE CROSS/BLUE SHIELD | Admitting: Vascular Surgery

## 2020-06-19 ENCOUNTER — Other Ambulatory Visit: Payer: Self-pay

## 2020-06-19 DIAGNOSIS — Z01419 Encounter for gynecological examination (general) (routine) without abnormal findings: Secondary | ICD-10-CM | POA: Insufficient documentation

## 2020-06-19 DIAGNOSIS — I83811 Varicose veins of right lower extremities with pain: Secondary | ICD-10-CM

## 2020-06-19 DIAGNOSIS — I83819 Varicose veins of unspecified lower extremities with pain: Secondary | ICD-10-CM

## 2020-06-22 ENCOUNTER — Encounter (INDEPENDENT_AMBULATORY_CARE_PROVIDER_SITE_OTHER): Payer: Self-pay | Admitting: Vascular Surgery

## 2020-06-22 DIAGNOSIS — I83819 Varicose veins of unspecified lower extremities with pain: Secondary | ICD-10-CM | POA: Insufficient documentation

## 2020-06-22 NOTE — Progress Notes (Signed)
° ° °  MRN : [id number]  MALLORY SCHAAD is a 49 y.o. (01/26/1971) female who presents with chief complaint of  Chief Complaint  Patient presents with   Follow-up    R GSV Laser  .    The patient's right lower extremity was sterilely prepped and draped.  The ultrasound machine was used to visualize the right great saphenous vein throughout its course.  A segment below the knee was selected for access.  The saphenous vein was accessed without difficulty using ultrasound guidance with a micropuncture needle.   An 0.018  wire was placed beyond the saphenofemoral junction through the sheath and the microneedle was removed.  The 65 cm sheath was then placed over the wire and the wire and dilator were removed.  The laser fiber was placed through the sheath and its tip was placed approximately 2 cm below the saphenofemoral junction.  Tumescent anesthesia was then created with a dilute lidocaine solution.  Laser energy was then delivered with constant withdrawal of the sheath and laser fiber.  Approximately 1852 Joules of energy were delivered over a length of 43 cm.  Sterile dressings were placed.  The patient tolerated the procedure well without complications.

## 2020-06-23 ENCOUNTER — Other Ambulatory Visit: Payer: Self-pay

## 2020-06-23 ENCOUNTER — Other Ambulatory Visit
Admit: 2020-06-23 | Discharge: 2020-06-23 | Disposition: A | Discharge status: Home / Self Care | Payer: BLUE CROSS/BLUE SHIELD | Source: Ambulatory Visit | Attending: Surgery | Admitting: Surgery

## 2020-06-23 ENCOUNTER — Ambulatory Visit (INDEPENDENT_AMBULATORY_CARE_PROVIDER_SITE_OTHER): Payer: BLUE CROSS/BLUE SHIELD

## 2020-06-23 ENCOUNTER — Other Ambulatory Visit (INDEPENDENT_AMBULATORY_CARE_PROVIDER_SITE_OTHER): Payer: Self-pay | Admitting: Vascular Surgery

## 2020-06-23 DIAGNOSIS — I83811 Varicose veins of right lower extremities with pain: Secondary | ICD-10-CM

## 2020-06-23 DIAGNOSIS — Z01812 Encounter for preprocedural laboratory examination: Secondary | ICD-10-CM | POA: Diagnosis not present

## 2020-06-23 DIAGNOSIS — I83819 Varicose veins of unspecified lower extremities with pain: Secondary | ICD-10-CM

## 2020-06-23 DIAGNOSIS — Z20822 Contact with and (suspected) exposure to covid-19: Secondary | ICD-10-CM | POA: Diagnosis not present

## 2020-06-23 LAB — SARS CORONAVIRUS 2 (TAT 6-24 HRS): SARS Coronavirus 2: NEGATIVE

## 2020-06-25 ENCOUNTER — Other Ambulatory Visit: Payer: Self-pay

## 2020-06-25 ENCOUNTER — Ambulatory Visit
Admission: RE | Admit: 2020-06-25 | Discharge: 2020-06-25 | Disposition: A | Discharge status: Home / Self Care | Payer: BLUE CROSS/BLUE SHIELD | Attending: Surgery | Admitting: Surgery

## 2020-06-25 ENCOUNTER — Ambulatory Visit: Payer: BLUE CROSS/BLUE SHIELD | Admitting: Anesthesiology

## 2020-06-25 ENCOUNTER — Encounter
Admission: RE | Disposition: A | Discharge status: Home / Self Care | Payer: Self-pay | Source: Home / Self Care | Attending: Surgery

## 2020-06-25 ENCOUNTER — Encounter: Payer: Self-pay | Admitting: Surgery

## 2020-06-25 DIAGNOSIS — G5602 Carpal tunnel syndrome, left upper limb: Secondary | ICD-10-CM | POA: Insufficient documentation

## 2020-06-25 DIAGNOSIS — Z87891 Personal history of nicotine dependence: Secondary | ICD-10-CM | POA: Insufficient documentation

## 2020-06-25 HISTORY — PX: CARPAL TUNNEL RELEASE: SHX101

## 2020-06-25 LAB — POCT PREGNANCY, URINE: Preg Test, Ur: NEGATIVE

## 2020-06-25 SURGERY — RELEASE, CARPAL TUNNEL, ENDOSCOPIC
Anesthesia: General | Laterality: Left

## 2020-06-25 MED ORDER — PROMETHAZINE HCL 25 MG/ML IJ SOLN: 6.2500 mg | INTRAMUSCULAR | Status: DC | PRN

## 2020-06-25 MED ORDER — EPHEDRINE SULFATE 50 MG/ML IJ SOLN
INTRAMUSCULAR | Status: DC | PRN
  Administered 2020-06-25: 5 mg via INTRAVENOUS

## 2020-06-25 MED ORDER — PROPOFOL 500 MG/50ML IV EMUL
INTRAVENOUS | Status: DC | PRN
  Administered 2020-06-25: 150 ug/kg/min via INTRAVENOUS

## 2020-06-25 MED ORDER — ESMOLOL HCL 100 MG/10ML IV SOLN
INTRAVENOUS | Status: DC | PRN
  Administered 2020-06-25: 20 mg via INTRAVENOUS

## 2020-06-25 MED ORDER — PROPOFOL 500 MG/50ML IV EMUL
INTRAVENOUS | Status: AC
  Filled 2020-06-25: qty 50

## 2020-06-25 MED ORDER — LACTATED RINGERS IV SOLN: INTRAVENOUS | Status: DC

## 2020-06-25 MED ORDER — ACETAMINOPHEN 10 MG/ML IV SOLN
INTRAVENOUS | Status: AC
  Filled 2020-06-25: qty 100

## 2020-06-25 MED ORDER — DEXMEDETOMIDINE (PRECEDEX) IN NS 20 MCG/5ML (4 MCG/ML) IV SYRINGE
PREFILLED_SYRINGE | INTRAVENOUS | Status: DC | PRN
  Administered 2020-06-25: 8 ug via INTRAVENOUS

## 2020-06-25 MED ORDER — ACETAMINOPHEN 10 MG/ML IV SOLN
INTRAVENOUS | Status: DC | PRN
  Administered 2020-06-25: 1000 mg via INTRAVENOUS

## 2020-06-25 MED ORDER — FENTANYL CITRATE (PF) 100 MCG/2ML IJ SOLN
INTRAMUSCULAR | Status: AC
Start: 2020-06-25 — End: ?
  Filled 2020-06-25: qty 2

## 2020-06-25 MED ORDER — MIDAZOLAM HCL 2 MG/2ML IJ SOLN
INTRAMUSCULAR | Status: DC | PRN
  Administered 2020-06-25: 2 mg via INTRAVENOUS

## 2020-06-25 MED ORDER — BUPIVACAINE HCL (PF) 0.5 % IJ SOLN
INTRAMUSCULAR | Status: AC
  Filled 2020-06-25: qty 30

## 2020-06-25 MED ORDER — PROPOFOL 10 MG/ML IV BOLUS
INTRAVENOUS | Status: DC | PRN
  Administered 2020-06-25: 200 mg via INTRAVENOUS
  Administered 2020-06-25: 20 mg via INTRAVENOUS

## 2020-06-25 MED ORDER — ONDANSETRON HCL 4 MG/2ML IJ SOLN
INTRAMUSCULAR | Status: DC | PRN
  Administered 2020-06-25 (×2): 4 mg via INTRAVENOUS

## 2020-06-25 MED ORDER — CEFAZOLIN SODIUM-DEXTROSE 2-4 GM/100ML-% IV SOLN
INTRAVENOUS | Status: AC
  Filled 2020-06-25: qty 100

## 2020-06-25 MED ORDER — BUPIVACAINE HCL (PF) 0.5 % IJ SOLN
INTRAMUSCULAR | Status: DC | PRN
  Administered 2020-06-25: 10 mL

## 2020-06-25 MED ORDER — OXYCODONE HCL 5 MG PO TABS: 5.0000 mg | ORAL_TABLET | Freq: Once | ORAL | Status: DC | PRN

## 2020-06-25 MED ORDER — FENTANYL CITRATE (PF) 100 MCG/2ML IJ SOLN
INTRAMUSCULAR | Status: DC | PRN
  Administered 2020-06-25 (×3): 25 ug via INTRAVENOUS

## 2020-06-25 MED ORDER — ORAL CARE MOUTH RINSE: 15.0000 mL | Freq: Once | OROMUCOSAL | Status: AC

## 2020-06-25 MED ORDER — IBUPROFEN 800 MG PO TABS: 800.0000 mg | ORAL_TABLET | Freq: Three times a day (TID) | ORAL | 1 refills | Status: AC | PRN

## 2020-06-25 MED ORDER — GLYCOPYRROLATE 0.2 MG/ML IJ SOLN
INTRAMUSCULAR | Status: DC | PRN
  Administered 2020-06-25: .2 mg via INTRAVENOUS

## 2020-06-25 MED ORDER — CEFAZOLIN SODIUM-DEXTROSE 2-4 GM/100ML-% IV SOLN
2.0000 g | INTRAVENOUS | Status: AC
  Administered 2020-06-25: 2 g via INTRAVENOUS

## 2020-06-25 MED ORDER — PROPOFOL 500 MG/50ML IV EMUL
INTRAVENOUS | Status: AC
  Filled 2020-06-25: qty 100

## 2020-06-25 MED ORDER — OXYCODONE HCL 5 MG/5ML PO SOLN: 5.0000 mg | Freq: Once | ORAL | Status: DC | PRN

## 2020-06-25 MED ORDER — FAMOTIDINE 20 MG PO TABS: 20.0000 mg | ORAL_TABLET | Freq: Once | ORAL | Status: DC

## 2020-06-25 MED ORDER — FENTANYL CITRATE (PF) 100 MCG/2ML IJ SOLN
25.0000 ug | INTRAMUSCULAR | Status: DC | PRN
  Administered 2020-06-25: 25 ug via INTRAVENOUS
  Administered 2020-06-25: 50 ug via INTRAVENOUS
  Administered 2020-06-25: 25 ug via INTRAVENOUS

## 2020-06-25 MED ORDER — LIDOCAINE HCL (CARDIAC) PF 100 MG/5ML IV SOSY
PREFILLED_SYRINGE | INTRAVENOUS | Status: DC | PRN
  Administered 2020-06-25: 100 mg via INTRAVENOUS

## 2020-06-25 MED ORDER — FENTANYL CITRATE (PF) 100 MCG/2ML IJ SOLN
INTRAMUSCULAR | Status: AC
  Filled 2020-06-25: qty 2

## 2020-06-25 MED ORDER — DEXAMETHASONE SODIUM PHOSPHATE 10 MG/ML IJ SOLN
INTRAMUSCULAR | Status: DC | PRN
  Administered 2020-06-25: 10 mg via INTRAVENOUS

## 2020-06-25 MED ORDER — CHLORHEXIDINE GLUCONATE 0.12 % MT SOLN: 15.0000 mL | Freq: Once | OROMUCOSAL | Status: AC

## 2020-06-25 MED ORDER — CHLORHEXIDINE GLUCONATE 0.12 % MT SOLN
OROMUCOSAL | Status: AC
  Administered 2020-06-25: 15 mL via OROMUCOSAL
  Filled 2020-06-25: qty 15

## 2020-06-25 MED ORDER — MIDAZOLAM HCL 2 MG/2ML IJ SOLN
INTRAMUSCULAR | Status: AC
  Filled 2020-06-25: qty 2

## 2020-06-25 SURGICAL SUPPLY — 31 items
BNDG COHESIVE 4X5 TAN STRL (GAUZE/BANDAGES/DRESSINGS) ×2 IMPLANT
BNDG ELASTIC 2X5.8 VLCR STR LF (GAUZE/BANDAGES/DRESSINGS) ×2 IMPLANT
BNDG ESMARK 4X12 TAN STRL LF (GAUZE/BANDAGES/DRESSINGS) ×2 IMPLANT
CANISTER SUCT 1200ML W/VALVE (MISCELLANEOUS) ×2 IMPLANT
CHLORAPREP W/TINT 26 (MISCELLANEOUS) ×2 IMPLANT
CORD BIP STRL DISP 12FT (MISCELLANEOUS) ×2 IMPLANT
COVER WAND RF STERILE (DRAPES) ×2 IMPLANT
CUFF TOURN SGL QUICK 18X4 (TOURNIQUET CUFF) ×2 IMPLANT
DRAPE SURG 17X11 SM STRL (DRAPES) ×2 IMPLANT
FORCEPS JEWEL BIP 4-3/4 STR (INSTRUMENTS) ×2 IMPLANT
GAUZE SPONGE 4X4 12PLY STRL (GAUZE/BANDAGES/DRESSINGS) ×2 IMPLANT
GAUZE XEROFORM 1X8 LF (GAUZE/BANDAGES/DRESSINGS) ×2 IMPLANT
GLOVE BIO SURGEON STRL SZ8 (GLOVE) ×2 IMPLANT
GLOVE INDICATOR 8.0 STRL GRN (GLOVE) ×2 IMPLANT
GOWN STRL REUS W/ TWL LRG LVL3 (GOWN DISPOSABLE) ×1 IMPLANT
GOWN STRL REUS W/ TWL XL LVL3 (GOWN DISPOSABLE) ×1 IMPLANT
GOWN STRL REUS W/TWL LRG LVL3 (GOWN DISPOSABLE) ×1
GOWN STRL REUS W/TWL XL LVL3 (GOWN DISPOSABLE) ×2
KIT CARPAL TUNNEL (MISCELLANEOUS) ×1
KIT ESCP INSRT D SLOT CANN KN (MISCELLANEOUS) ×1 IMPLANT
KIT TURNOVER KIT A (KITS) ×2 IMPLANT
NS IRRIG 500ML POUR BTL (IV SOLUTION) ×2 IMPLANT
PACK EXTREMITY (MISCELLANEOUS) ×2 IMPLANT
SPLINT WRIST LG LT TX990309 (SOFTGOODS) ×1 IMPLANT
SPLINT WRIST LG RT TX900304 (SOFTGOODS) IMPLANT
SPLINT WRIST M LT TX990308 (SOFTGOODS) IMPLANT
SPLINT WRIST M RT TX990303 (SOFTGOODS) IMPLANT
SPLINT WRIST XL LT TX990310 (SOFTGOODS) IMPLANT
SPLINT WRIST XL RT TX990305 (SOFTGOODS) IMPLANT
STOCKINETTE IMPERVIOUS 9X36 MD (GAUZE/BANDAGES/DRESSINGS) ×2 IMPLANT
SUT PROLENE 4 0 PS 2 18 (SUTURE) ×2 IMPLANT

## 2020-06-25 NOTE — Anesthesia Procedure Notes (Signed)
Procedure Name: LMA Insertion Performed by: Fletcher-Harrison, Krishon Adkison, CRNA Pre-anesthesia Checklist: Patient identified, Emergency Drugs available, Suction available and Patient being monitored Patient Re-evaluated:Patient Re-evaluated prior to induction Oxygen Delivery Method: Circle system utilized Preoxygenation: Pre-oxygenation with 100% oxygen Induction Type: IV induction Ventilation: Mask ventilation without difficulty LMA: LMA inserted LMA Size: 4.0 Number of attempts: 1 Placement Confirmation: positive ETCO2,  CO2 detector and breath sounds checked- equal and bilateral Tube secured with: Tape Dental Injury: Teeth and Oropharynx as per pre-operative assessment        

## 2020-06-25 NOTE — Discharge Instructions (Addendum)
Orthopedic discharge instructions: Keep dressing dry and intact. Keep hand elevated above heart level. May shower after dressing removed on postop day 4 (Sunday). Cover sutures with Band-Aids after drying off. Apply ice to affected area frequently. Take ibuprofen 600-800 mg TID with meals for 7-10 days, then as necessary. Take ES Tylenol or pain medication as prescribed when needed.  Return for follow-up in 10-14 days or as scheduled.  AMBULATORY SURGERY  DISCHARGE INSTRUCTIONS   1) The drugs that you were given will stay in your system until tomorrow so for the next 24 hours you should not:  A) Drive an automobile B) Make any legal decisions C) Drink any alcoholic beverage   2) You may resume regular meals tomorrow.  Today it is better to start with liquids and gradually work up to solid foods.  You may eat anything you prefer, but it is better to start with liquids, then soup and crackers, and gradually work up to solid foods.   3) Please notify your doctor immediately if you have any unusual bleeding, trouble breathing, redness and pain at the surgery site, drainage, fever, or pain not relieved by medication.    4) Additional Instructions:  Please contact your physician with any problems or Same Day Surgery at [phone], Monday through Friday 6 am to 4 pm, or [facility] [place] [facility] number at [phone]. 

## 2020-06-25 NOTE — Transfer of Care (Signed)
Immediate Anesthesia Transfer of Care Note  Patient: ARIADNA SETTER  Procedure(s) Performed: CARPAL TUNNEL RELEASE ENDOSCOPIC (Left )  Patient Location: PACU  Anesthesia Type:General  Level of Consciousness: awake, drowsy and patient cooperative  Airway & Oxygen Therapy: Patient Spontanous Breathing and Patient connected to face mask oxygen  Post-op Assessment: Report given to RN and Post -op Vital signs reviewed and stable  Post vital signs: Reviewed and stable  Last Vitals:  Vitals Value Taken Time  BP 116/82 06/25/20 0820  Temp    Pulse 76 06/25/20 0821  Resp 13 06/25/20 0821  SpO2 100 % 06/25/20 0821  Vitals shown include unvalidated device data.  Last Pain:  Vitals:   06/25/20 0820  TempSrc:   PainSc: (P) 0-No pain         Complications: No complications documented.

## 2020-06-25 NOTE — H&P (Signed)
History of Present Illness: Kaitlyn Jordan is a 49 y.o. who presents today for a history and physical. She is to undergo a left endoscopic carpal tunnel release on 06/25/2020. Last seen in the clinic on 04/23/2020. No change in her condition since that time. Patient initially seen for evaluation of left hand numbness and tingling ongoing for the past 3-4 months. The patient denies any injury or trauma affecting the left hand, the patient is right-hand dominant. 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 The patient does report a history of carpal tunnel syndrome in the past, she did undergo a right carpal tunnel release over 20 years ago, at the time she was experiencing left hand symptoms but she states that soon after the procedure the symptoms resolved. She now reports that over the past 3-4 months she is having numbness and tingling and burning sensation in the left thumb, index in addition to middle fingers. The patient reports pain primarily at night, she has been wearing braces in addition to performing stretches for her left wrist numbness and tingling but she has not noticed significant relief. The patient denies any neck pain at today's visit. Denies any trauma or injury affecting left hand. At night her pain can reach a 10 out of 10. When she is at work if she is talking on her cell phone or holding her wrist in a certain position she will begin to experience the numbness and tingling. She denies any personal history of heart attack, stroke, asthma or COPD. No personal history of blood clots.  Past Medical History: . Chickenpox  . DUB (dysfunctional uterine bleeding)  . GERD (gastroesophageal reflux disease)  . Menorrhagia with regular cycle  . Thyroid disease   Past Surgical History: . Arthroscopically assisted anterior cruciate ligament reconstruction using bone-patella tendon-bone autograft, left  knee. Left 09/21/2019  Dr. Joice Lofts  . Carpal Tunnel Release Right 2000  . COLONOSCOPY 07/20/2018  Adenomatous Polyp: CBF 07/2023   Past Family History: . Hyperlipidemia (Elevated cholesterol) Mother  . High blood pressure (Hypertension) Mother  . High blood pressure (Hypertension) Father  . Hyperlipidemia (Elevated cholesterol) Father   Medications: . cetirizine (ZYRTEC) 10 MG tablet Take by mouth Take 10 mg by mouth daily.  . cholecalciferol, vitamin D3, (VITAMIN D3 ORAL) Take 1 tablet by mouth once daily  . cyanocobalamin (VITAMIN B12) 1000 MCG tablet Take by mouth 1 tablet daily  . EPINEPHrine (EPIPEN) 0.3 mg/0.3 mL auto-injector Inject 0.3 mg into the muscle as needed  . EUTHYROX 100 mcg tablet Take 100 mcg by mouth once daily  . ibuprofen (MOTRIN) 200 MG tablet Take by mouth Take 600 mg by mouth every 6 (six) hours as needed for headache or moderate pain.  Marland Kitchen MAGNESIUM CHLORIDE ORAL Take 1 tablet by mouth once daily  . omeprazole (PRILOSEC) 20 MG DR capsule Take 1 capsule (20 mg total) by mouth once daily 90 capsule 3  . TURMERIC ORAL Take 1 tablet by mouth once daily   Allergies: [name] Bee Swelling  Localized swelling   Review of Systems: A comprehensive 14 point ROS was performed, reviewed, and the pertinent orthopaedic findings are documented in the HPI.  Physical Exam: BP 126/86  Ht 170.2 cm (5\' 7" )  Wt 95.3 kg (210 lb)  BMI 32.89 kg/m   General: Well-developed well-nourished female seen in no acute distress.   HEENT:  Atraumatic,normocephalic. Pupils are equal and reactive to light. Oropharynx is clear with moist mucosa  Lungs: Clear to auscultation bilaterally   Cardiovascular: Regular rate and rhythm. Normal S1, S2. No murmurs. No appreciable gallops or rubs. Peripheral pulses are palpable.  Abdomen: Soft, non-tender, nondistended. Bowel sounds present  Left Upper Extremity Exam: Normal shoulder contour. Good active and passive range of motion and  stability of the shoulder, elbow, and wrist. Normal motion of the hand and digits. No swelling, erythema, or ecchymosis is noted. There is no triggering or locking of the digits noted. No thenar atrophy is visualized. No intrinsic wasting. The patient has a positive Phalen's test. The patient has a positive Tinel's test. The patient has decreased pinprick and light touch sensation in the median nerve distribution. Positive compression test the left wrist. There is normal grip strength and pincer strength. The patient has less than 2 second capillary refill with good skin warmth. Normal radial and ulnar pulse is palpated.   Neurological:  The patient is alert and oriented Sensation to light touch appears to be intact and within normal limits Gross motor strength appeared to be equal to 5/5  Vascular :  Peripheral pulses felt to be palpable. Capillary refill appears to be intact and within normal limits  Impression: Carpal tunnel syndrome left wrist  Plan:  The treatment options, including both surgical and nonsurgical choices, have been discussed in detail with the patient. The risks (including bleeding, infection, nerve and/or blood vessel injury, persistent or recurrent pain/paresthesias, weakness of grip, need for further surgery, blood clots, strokes, heart attacks or arrhythmias, pneumonia, etc.) and benefits of the surgical procedure were discussed. The patient states her understanding and agrees to proceed. A formal written consent will be obtained by the nursing staff.   H&P reviewed and patient re-examined. No changes.

## 2020-06-25 NOTE — Anesthesia Preprocedure Evaluation (Addendum)
Anesthesia Evaluation  Patient identified by MRN, date of birth, ID band Patient awake    Reviewed: Allergy & Precautions, H&P , NPO status , Patient's Chart, lab work & pertinent test results  History of Anesthesia Complications (+) PONV and Emergence Delirium  Airway Mallampati: II  TM Distance: >3 FB Neck ROM: full    Dental  (+) Teeth Intact   Pulmonary neg sleep apnea, neg COPD, former smoker,    breath sounds clear to auscultation       Cardiovascular (-) angina(-) Past MI and (-) Cardiac Stents negative cardio ROS  (-) dysrhythmias  Rhythm:regular Rate:Normal     Neuro/Psych negative neurological ROS  negative psych ROS   GI/Hepatic Neg liver ROS, GERD  Medicated and Controlled,  Endo/Other  Hypothyroidism   Renal/GU      Musculoskeletal   Abdominal   Peds  Hematology negative hematology ROS (+)   Anesthesia Other Findings Past Medical History: No date: Complication of anesthesia No date: GERD (gastroesophageal reflux disease) No date: Hyperthyroidism No date: Hypothyroidism No date: PONV (postoperative nausea and vomiting) No date: UTI (urinary tract infection)  Past Surgical History: No date: CARPAL TUNNEL RELEASE 05/02/2020: HYSTEROSCOPY WITH D & C; N/A     Comment:  Procedure: HYSTEROSCOPY, DILATATION & CURETTAGE;                Surgeon: [name], MD;  Location: [facility];                Service: Gynecology;  Laterality: N/A; 09/21/2019: KNEE ARTHROSCOPY WITH ANTERIOR CRUCIATE LIGAMENT (ACL)  REPAIR; Left     Comment:  Procedure: KNEE ARTHROSCOPY WITH ANTERIOR CRUCIATE               LIGAMENT (ACL) REPAIR, BONE PATELLA TENDON AUTOGRAFT;                Surgeon: Christena Flake, MD;  Location: [facility];                Service: Orthopedics;  Laterality: Left; 05/16/2020: PLANTAR FASCIA RELEASE; Right     Comment:  Procedure: ENDOSCOPIC PLANTAR FASCIOTOMY RELEASE RIGHT               HEEL;   Surgeon: Gwyneth Revels, DPM;  Location: [facility];              Service: Podiatry;  Laterality: Right;  BMI    Body Mass Index: 33.20 kg/m      Reproductive/Obstetrics negative OB ROS                            Anesthesia Physical Anesthesia Plan  ASA: II  Anesthesia Plan: General LMA   Post-op Pain Management:    Induction:   PONV Risk Score and Plan: Dexamethasone, Ondansetron, Midazolam, Treatment may vary due to age or medical condition, Propofol infusion and TIVA  Airway Management Planned:   Additional Equipment:   Intra-op Plan:   Post-operative Plan:   Informed Consent: I have reviewed the patients History and Physical, chart, labs and discussed the procedure including the risks, benefits and alternatives for the proposed anesthesia with the patient or authorized representative who has indicated his/her understanding and acceptance.     Dental Advisory Given  Plan Discussed with: Anesthesiologist, CRNA and Surgeon  Anesthesia Plan Comments:         Anesthesia Quick Evaluation

## 2020-06-25 NOTE — Op Note (Signed)
06/25/2020  8:22 AM  Patient:   Kaitlyn Jordan  Pre-Op Diagnosis:   Left carpal tunnel syndrome.  Post-Op Diagnosis:   Same.  Procedure:   Endoscopic left carpal tunnel release.  Surgeon:   Maryagnes Amos, MD  Assistant:   Orvan July, PA-S  Anesthesia:   General LMA  Findings:   As above.  Complications:   None  EBL:   0 cc  Fluids:   600 cc crystalloid  TT:   14 minutes at 250 mmHg  Drains:   None  Closure:   4-0 Prolene interrupted sutures  Brief Clinical Note:   The patient is a 49 year old female with a long history of grossly worsening pain and paresthesias to her left hand. Her symptoms have progressed despite medications, activity modification, etc. Her history and examination are consistent with carpal tunnel syndrome. The patient is status post a prior right carpal tunnel release. The patient presents at this time for an endoscopic left carpal tunnel release.   Procedure:   The patient was brought into the operating room and lain in the supine position. After adequate general laryngeal mask anesthesia was obtained, the left hand and upper extremity were prepped with ChloraPrep solution before being draped sterilely. Preoperative antibiotics were administered. A timeout was performed to verify the appropriate surgical site before the limb was exsanguinated with an Esmarch and the tourniquet inflated to 250 mmHg. An approximately 1.5-2 cm incision was made over the volar wrist flexion crease, centered over the palmaris longus tendon. The incision was carried down through the subcutaneous tissues with care taken to identify and protect any neurovascular structures. The distal forearm fascia was penetrated just proximal to the transverse carpal ligament. The soft tissues were released off the superficial and deep surfaces of the distal forearm fascia and this was released proximally for 3-4 cm under direct visualization.  Attention was directed distally. The Therapist, nutritional  was passed beneath the transverse carpal ligament along the ulnar aspect of the carpal tunnel and used to release any adhesions as well as to remove any adherent synovial tissue before first the smaller then the larger of the two dilators were passed beneath the transverse carpal ligament along the ulnar margin of the carpal tunnel. The slotted cannula was introduced and the endoscope was placed into the slotted cannula and the undersurface of the transverse carpal ligament visualized. The distal margin of the transverse carpal ligament was marked by placing a 25-gauge needle percutaneously at Kaplan's cardinal point so that it entered the distal portion of the slotted cannula. Under endoscopic visualization, the transverse carpal ligament was released from proximal to distal using the end-cutting blade. A second pass was performed to ensure complete release of the ligament. The adequacy of release was verified both endoscopically and by palpation using the freer elevator.  The wound was irrigated thoroughly with sterile saline solution before being closed using 4-0 Prolene interrupted sutures. A total of 10 cc of 0.5% plain Sensorcaine was injected in and around the incision before a sterile bulky dressing was applied to the wound. The patient was placed into a volar wrist splint before being awakened and returned to the recovery room in satisfactory condition after tolerating the procedure well.

## 2020-06-26 ENCOUNTER — Encounter: Payer: Self-pay | Admitting: Surgery

## 2020-06-26 NOTE — Anesthesia Postprocedure Evaluation (Signed)
Anesthesia Post Note  Patient: Kaitlyn Jordan  Procedure(s) Performed: CARPAL TUNNEL RELEASE ENDOSCOPIC (Left )  Patient location during evaluation: PACU Anesthesia Type: General Level of consciousness: awake and alert Pain management: pain level controlled Vital Signs Assessment: post-procedure vital signs reviewed and stable Respiratory status: spontaneous breathing, nonlabored ventilation and respiratory function stable Cardiovascular status: blood pressure returned to baseline and stable Postop Assessment: no apparent nausea or vomiting Anesthetic complications: no   No complications documented.   Last Vitals:  Vitals:   06/25/20 0850 06/25/20 0907  BP: (!) 139/91 (!) 143/90  Pulse: 67 62  Resp: 18 16  Temp: (!) 36.3 C (!) 36.4 C  SpO2: 97% 99%    Last Pain:  Vitals:   06/25/20 0907  TempSrc: Temporal  PainSc: 2                  Karleen Hampshire

## 2020-07-01 ENCOUNTER — Telehealth (INDEPENDENT_AMBULATORY_CARE_PROVIDER_SITE_OTHER): Payer: Self-pay

## 2020-07-01 NOTE — Telephone Encounter (Signed)
I left a detailed message on patient voicemail with medical recommendations  ?

## 2020-07-01 NOTE — Telephone Encounter (Signed)
That's to be expected.  Especially if that area is somewhat red/knotty.   It is still relatively soon and it can take some time for the inflammation to fully resolve.  She should use ibuprofen 800 mg for the pain as it is a good anti inflammatory

## 2020-07-17 ENCOUNTER — Ambulatory Visit (INDEPENDENT_AMBULATORY_CARE_PROVIDER_SITE_OTHER): Payer: BC Managed Care – PPO | Admitting: Vascular Surgery

## 2020-07-17 ENCOUNTER — Other Ambulatory Visit: Payer: Self-pay

## 2020-07-17 VITALS — BP 115/74 | HR 101 | Ht 67.0 in | Wt 210.0 lb

## 2020-07-17 DIAGNOSIS — E039 Hypothyroidism, unspecified: Secondary | ICD-10-CM

## 2020-07-17 DIAGNOSIS — E785 Hyperlipidemia, unspecified: Secondary | ICD-10-CM | POA: Diagnosis not present

## 2020-07-17 DIAGNOSIS — I83819 Varicose veins of unspecified lower extremities with pain: Secondary | ICD-10-CM

## 2020-07-17 NOTE — Progress Notes (Signed)
MRN : [id number]  Kaitlyn Jordan is a 49 y.o. (Aug 03, 1971) female who presents with chief complaint of No chief complaint on file. Marland Kitchen  History of Present Illness:   The patient returns to the office for followup status post laser ablation of the right great saphenous vein on 06/19/2020.    The patient note significant improvement in the lower extremity pain but not resolution of the symptoms. The patient notes multiple residual varicosities bilaterally which continued to hurt with dependent positions and remained tender to palpation. The patient's swelling is minimally from preoperative status. The patient continues to wear graduated compression stockings on a daily basis but these are not eliminating the pain and discomfort. The patient continues to use over-the-counter anti-inflammatory medications to treat the pain and related symptoms but this has not given the patient relief. The patient notes the pain in the lower extremities is causing problems with daily exercise, problems at work and even with household activities such as preparing meals and doing dishes.  The patient is otherwise done well and there have been no complications related to the laser procedure or interval changes in the patient's overall   Post laser ultrasound (done 06/23/2020) shows successful ablation of the right great saphenous vein    No outpatient medications have been marked as taking for the 07/17/20 encounter (Appointment) with Gilda Crease, Latina Craver, MD.    Past Medical History:  Diagnosis Date  . Complication of anesthesia   . GERD (gastroesophageal reflux disease)   . Hyperthyroidism   . Hypothyroidism   . PONV (postoperative nausea and vomiting)   . UTI (urinary tract infection)     Past Surgical History:  Procedure Laterality Date  . CARPAL TUNNEL RELEASE    . CARPAL TUNNEL RELEASE Left 06/25/2020   Procedure: CARPAL TUNNEL RELEASE ENDOSCOPIC;  Surgeon: Christena Flake, MD;  Location: [facility];   Service: Orthopedics;  Laterality: Left;  . HYSTEROSCOPY WITH D & C N/A 05/02/2020   Procedure: HYSTEROSCOPY, DILATATION & CURETTAGE;  Surgeon: [name], MD;  Location: [facility];  Service: Gynecology;  Laterality: N/A;  . KNEE ARTHROSCOPY WITH ANTERIOR CRUCIATE LIGAMENT (ACL) REPAIR Left 09/21/2019   Procedure: KNEE ARTHROSCOPY WITH ANTERIOR CRUCIATE LIGAMENT (ACL) REPAIR, BONE PATELLA TENDON AUTOGRAFT;  Surgeon: Christena Flake, MD;  Location: [facility];  Service: Orthopedics;  Laterality: Left;  . PLANTAR FASCIA RELEASE Right 05/16/2020   Procedure: ENDOSCOPIC PLANTAR FASCIOTOMY RELEASE RIGHT HEEL;  Surgeon: Gwyneth Revels, DPM;  Location: [facility];  Service: Podiatry;  Laterality: Right;    Social History Social History   Tobacco Use  . Smoking status: Former Smoker    Packs/day: 0.25    Years: 10.00    Pack years: 2.50    Types: Cigarettes    Quit date: 09/16/2012    Years since quitting: 7.8  . Smokeless tobacco: Never Used  Vaping Use  . Vaping Use: Never used  Substance Use Topics  . Alcohol use: No  . Drug use: No    Family History Family History  Problem Relation Age of Onset  . Hypertension Mother   . Hypertension Father   . Skin cancer Father 13  . Hypertension Maternal Grandmother   . Heart disease Maternal Grandmother   . Hypertension Maternal Grandfather   . Breast cancer Neg Hx     Allergies  Allergen Reactions  . Bee Venom Swelling    Localized swelling       REVIEW OF SYSTEMS (Negative unless checked)  Constitutional: []   Weight loss  [] Fever  [] Chills Cardiac: [] Chest pain   [] Chest pressure   [] Palpitations   [] Shortness of breath when laying flat   [] Shortness of breath with exertion. Vascular:  [] Pain in legs with walking   [x] Pain in legs at rest  [] History of DVT   [] Phlebitis   [x] Swelling in legs   [x] Varicose veins   [] Non-healing ulcers Pulmonary:   [] Uses home oxygen   [] Productive cough   [] Hemoptysis   [] Wheeze  [] COPD    [] Asthma Neurologic:  [] Dizziness   [] Seizures   [] History of stroke   [] History of TIA  [] Aphasia   [] Vissual changes   [] Weakness or numbness in arm   [] Weakness or numbness in leg Musculoskeletal:   [] Joint swelling   [] Joint pain   [] Low back pain Hematologic:  [] Easy bruising  [] Easy bleeding   [] Hypercoagulable state   [] Anemic Gastrointestinal:  [] Diarrhea   [] Vomiting  [] Gastroesophageal reflux/heartburn   [] Difficulty swallowing. Genitourinary:  [] Chronic kidney disease   [] Difficult urination  [] Frequent urination   [] Blood in urine Skin:  [] Rashes   [] Ulcers  Psychological:  [] History of anxiety   []  History of major depression.  Physical Examination  There were no vitals filed for this visit. There is no height or weight on file to calculate BMI. Gen: WD/WN, NAD Head: [place]/AT, No temporalis wasting.  Ear/Nose/Throat: Hearing grossly intact, nares w/o erythema or drainage Eyes: PER, EOMI, sclera nonicteric.  Neck: Supple, no large masses.   Pulmonary:  Good air movement, no audible wheezing bilaterally, no use of accessory muscles.  Cardiac: RRR, no JVD Vascular: Large varicosities present extensively greater than 8 mm right leg.  Mild venous stasis changes to the legs bilaterally.  1+ soft pitting edema.  GSV is a palpable cord in the thigh Vessel Right Left  Radial Palpable Palpable  Gastrointestinal: Non-distended. No guarding/no peritoneal signs.  Musculoskeletal: M/S 5/5 throughout.  No deformity or atrophy.  Neurologic: CN 2-12 intact. Symmetrical.  Speech is fluent. Motor exam as listed above. Psychiatric: Judgment intact, Mood & affect appropriate for pt's clinical situation. Dermatologic: Mild venous rashes no ulcers noted.  No changes consistent with cellulitis.   CBC Lab Results  Component Value Date   WBC 5.9 04/30/2020   HGB 15.1 (H) 04/30/2020   HCT 45.0 04/30/2020   MCV 89.1 04/30/2020   PLT 305 04/30/2020    BMET    Component Value Date/Time   NA  140 04/30/2020 0834   K 3.8 04/30/2020 0834   CL 102 04/30/2020 0834   CO2 26 04/30/2020 0834   GLUCOSE 87 04/30/2020 0834   BUN 20 04/30/2020 0834   CREATININE 1.08 (H) 04/30/2020 0834   CALCIUM 10.0 04/30/2020 0834   GFRNONAA >60 04/30/2020 0834   GFRAA >60 04/30/2020 0834   CrCl cannot be calculated (Patient's most recent lab result is older than the maximum 21 days allowed.).  COAG No results found for: INR, PROTIME  Radiology VAS LOWER EXTREMITY VENOUS POST ABLATION  Result Date: 06/23/2020  Lower Venous Reflux Study Indications: Post ablation right.  Performing Technologist: RVT  Examination Guidelines: A complete evaluation includes B-mode imaging, spectral Doppler, color Doppler, and power Doppler as needed of all accessible portions of each vessel. Bilateral testing is considered an integral part of a complete examination. Limited examinations for reoccurring indications may be performed as noted. The reflux portion of the exam is performed with the patient in reverse Trendelenburg. Significant venous reflux is defined as >500 ms  in the superficial venous system, and >1 second in the deep venous system.   Summary: Right: - No evidence of deep vein thrombosis seen in the right lower extremity, from the common femoral through the popliteal veins. - Successful right GSV ablation closed from 1.37cm from the SFJ.  *See table(s) above for measurements and observations. Electronically signed by Levora Dredge MD on 06/23/2020 at 5:27:19 PM.    Final      Assessment/Plan 1. Varicose veins with pain Recommend:  The patient has had successful ablation of the previously incompetent saphenous venous system but still has persistent symptoms of pain and swelling that are having a negative impact on daily life and daily activities.  Patient should undergo injection sclerotherapy to treat the residual varicosities.  The risks, benefits and alternative therapies were reviewed  in detail with the patient.  All questions were answered.  The patient agrees to proceed with sclerotherapy at their convenience.  The patient will continue wearing the graduated compression stockings and using the over-the-counter pain medications to treat her symptoms.   2. Hyperlipidemia, unspecified hyperlipidemia type Continue statin as ordered and reviewed, no changes at this time   3. Hypothyroidism, unspecified type Continue hormone replacement as already ordered, these medications have been reviewed and there are no changes at this time.     Levora Dredge, MD  07/17/2020 2:21 PM

## 2020-07-18 ENCOUNTER — Encounter (INDEPENDENT_AMBULATORY_CARE_PROVIDER_SITE_OTHER): Payer: Self-pay | Admitting: Vascular Surgery

## 2020-08-13 ENCOUNTER — Other Ambulatory Visit: Payer: Self-pay | Admitting: Nurse Practitioner

## 2020-08-13 DIAGNOSIS — Z1231 Encounter for screening mammogram for malignant neoplasm of breast: Secondary | ICD-10-CM

## 2020-09-10 ENCOUNTER — Ambulatory Visit (INDEPENDENT_AMBULATORY_CARE_PROVIDER_SITE_OTHER): Payer: BC Managed Care – PPO | Admitting: Vascular Surgery

## 2020-09-10 ENCOUNTER — Other Ambulatory Visit: Payer: Self-pay

## 2020-09-10 ENCOUNTER — Encounter (INDEPENDENT_AMBULATORY_CARE_PROVIDER_SITE_OTHER): Payer: Self-pay | Admitting: Vascular Surgery

## 2020-09-10 VITALS — BP 135/76 | HR 63 | Ht 66.0 in | Wt 205.0 lb

## 2020-09-10 DIAGNOSIS — I83819 Varicose veins of unspecified lower extremities with pain: Secondary | ICD-10-CM

## 2020-09-10 DIAGNOSIS — I83811 Varicose veins of right lower extremities with pain: Secondary | ICD-10-CM | POA: Diagnosis not present

## 2020-09-10 NOTE — Progress Notes (Signed)
Varicose veins of right  lower extremity with inflammation (454.1  I83.10) Current Plans   Indication: Patient presents with symptomatic varicose veins of the right  lower extremity.   Procedure: Sclerotherapy using hypertonic saline mixed with 1% Lidocaine was performed on the right lower extremity. Compression wraps were placed. The patient tolerated the procedure well. 

## 2020-09-22 ENCOUNTER — Other Ambulatory Visit: Payer: Self-pay | Admitting: Nurse Practitioner

## 2020-10-15 ENCOUNTER — Ambulatory Visit (INDEPENDENT_AMBULATORY_CARE_PROVIDER_SITE_OTHER): Payer: BC Managed Care – PPO | Admitting: Vascular Surgery

## 2020-10-15 ENCOUNTER — Encounter (INDEPENDENT_AMBULATORY_CARE_PROVIDER_SITE_OTHER): Payer: Self-pay

## 2020-10-22 ENCOUNTER — Encounter (INDEPENDENT_AMBULATORY_CARE_PROVIDER_SITE_OTHER): Payer: Self-pay | Admitting: Vascular Surgery

## 2020-10-22 ENCOUNTER — Ambulatory Visit (INDEPENDENT_AMBULATORY_CARE_PROVIDER_SITE_OTHER): Payer: BC Managed Care – PPO | Admitting: Vascular Surgery

## 2020-10-22 ENCOUNTER — Other Ambulatory Visit: Payer: Self-pay

## 2020-10-22 VITALS — BP 116/80 | HR 87 | Resp 16 | Wt 205.8 lb

## 2020-10-22 DIAGNOSIS — I83819 Varicose veins of unspecified lower extremities with pain: Secondary | ICD-10-CM | POA: Diagnosis not present

## 2020-10-22 DIAGNOSIS — I83811 Varicose veins of right lower extremities with pain: Secondary | ICD-10-CM | POA: Diagnosis not present

## 2020-10-22 NOTE — Progress Notes (Signed)
Varicose veins of RIGHT lower extremity with inflammation (454.1  I83.10) Current Plans   Indication: Patient presents with symptomatic varicose veins of the RIGHT lower extremity.   Procedure: Sclerotherapy using hypertonic saline mixed with 1% Lidocaine was performed on the RIGHT lower extremity. Compression wraps were placed. The patient tolerated the procedure well.

## 2020-10-29 ENCOUNTER — Ambulatory Visit
Admission: RE | Admit: 2020-10-29 | Discharge: 2020-10-29 | Disposition: A | Discharge status: Home / Self Care | Payer: BC Managed Care – PPO | Source: Ambulatory Visit | Attending: Nurse Practitioner | Admitting: Nurse Practitioner

## 2020-10-29 ENCOUNTER — Other Ambulatory Visit: Payer: Self-pay

## 2020-10-29 DIAGNOSIS — Z1231 Encounter for screening mammogram for malignant neoplasm of breast: Secondary | ICD-10-CM | POA: Diagnosis not present

## 2020-11-06 DIAGNOSIS — L910 Hypertrophic scar: Secondary | ICD-10-CM | POA: Insufficient documentation

## 2020-11-19 ENCOUNTER — Other Ambulatory Visit: Payer: Self-pay

## 2020-11-19 ENCOUNTER — Encounter (INDEPENDENT_AMBULATORY_CARE_PROVIDER_SITE_OTHER): Payer: Self-pay | Admitting: Vascular Surgery

## 2020-11-19 ENCOUNTER — Ambulatory Visit (INDEPENDENT_AMBULATORY_CARE_PROVIDER_SITE_OTHER): Payer: BC Managed Care – PPO | Admitting: Vascular Surgery

## 2020-11-19 VITALS — BP 104/61 | HR 73 | Ht 67.0 in | Wt 207.0 lb

## 2020-11-19 DIAGNOSIS — I83813 Varicose veins of bilateral lower extremities with pain: Secondary | ICD-10-CM | POA: Diagnosis not present

## 2020-11-19 DIAGNOSIS — I83819 Varicose veins of unspecified lower extremities with pain: Secondary | ICD-10-CM

## 2020-11-19 NOTE — Progress Notes (Signed)
Varicose veins of bilateral  lower extremity with inflammation (454.1  I83.10) Current Plans   Indication: Patient presents with symptomatic varicose veins of the bilateral  lower extremity.   Procedure: Sclerotherapy using hypertonic saline mixed with 1% Lidocaine was performed on the bilateral lower extremity. Compression wraps were placed. The patient tolerated the procedure well. 

## 2020-12-19 ENCOUNTER — Other Ambulatory Visit: Payer: Self-pay

## 2020-12-19 MED FILL — Levothyroxine Sodium Tab 100 MCG: ORAL | 90 days supply | Qty: 90 | Fill #0 | Status: AC

## 2021-01-07 ENCOUNTER — Other Ambulatory Visit: Payer: Self-pay

## 2021-01-07 MED FILL — Omeprazole Cap Delayed Release 20 MG: ORAL | 90 days supply | Qty: 90 | Fill #0 | Status: AC

## 2021-01-07 MED FILL — Omeprazole Cap Delayed Release 20 MG: ORAL | 30 days supply | Qty: 30 | Fill #0 | Status: CN

## 2021-01-08 ENCOUNTER — Other Ambulatory Visit: Payer: Self-pay

## 2021-01-14 ENCOUNTER — Other Ambulatory Visit: Payer: Self-pay

## 2021-02-24 ENCOUNTER — Other Ambulatory Visit: Payer: Self-pay

## 2021-02-24 MED ORDER — ZOSTER VAC RECOMB ADJUVANTED 50 MCG/0.5ML IM SUSR
INTRAMUSCULAR | 1 refills | Status: AC
  Filled 2021-02-24: qty 1, 1d supply, fill #0

## 2021-02-25 ENCOUNTER — Other Ambulatory Visit: Payer: Self-pay

## 2021-03-23 ENCOUNTER — Other Ambulatory Visit: Payer: Self-pay

## 2021-03-23 MED ORDER — LEVOTHYROXINE SODIUM 100 MCG PO TABS
100.0000 ug | ORAL_TABLET | Freq: Every day | ORAL | 1 refills | Status: DC
  Filled 2021-03-23: qty 90, 90d supply, fill #0
  Filled 2021-06-22: qty 90, 90d supply, fill #1

## 2021-04-13 ENCOUNTER — Other Ambulatory Visit: Payer: Self-pay

## 2021-04-13 MED ORDER — OMEPRAZOLE 20 MG PO CPDR
20.0000 mg | DELAYED_RELEASE_CAPSULE | Freq: Every day | ORAL | 3 refills | Status: DC
Start: 2021-04-13 — End: 2023-03-25
  Filled 2021-04-13: qty 90, 90d supply, fill #0
  Filled 2021-07-09: qty 90, 90d supply, fill #1
  Filled 2021-10-19: qty 90, 90d supply, fill #2
  Filled 2022-01-11: qty 90, 90d supply, fill #3

## 2021-05-25 ENCOUNTER — Other Ambulatory Visit: Payer: Self-pay

## 2021-05-25 MED ORDER — AMOXICILLIN-POT CLAVULANATE 875-125 MG PO TABS
ORAL_TABLET | ORAL | 0 refills | Status: DC
  Filled 2021-05-25: qty 14, 7d supply, fill #0

## 2021-05-25 MED ORDER — FLUCONAZOLE 150 MG PO TABS
ORAL_TABLET | ORAL | 0 refills | Status: DC
  Filled 2021-05-25: qty 2, 3d supply, fill #0

## 2021-06-22 ENCOUNTER — Other Ambulatory Visit: Payer: Self-pay

## 2021-06-25 ENCOUNTER — Other Ambulatory Visit: Payer: Self-pay

## 2021-06-25 MED ORDER — FLUARIX QUADRIVALENT 0.5 ML IM SUSY
PREFILLED_SYRINGE | INTRAMUSCULAR | 0 refills | Status: DC
Start: 2021-06-25 — End: 2023-03-25
  Filled 2021-06-25: qty 0.5, 1d supply, fill #0

## 2021-07-09 ENCOUNTER — Other Ambulatory Visit: Payer: Self-pay

## 2021-08-14 ENCOUNTER — Other Ambulatory Visit: Payer: Self-pay

## 2021-08-14 MED ORDER — AMOXICILLIN-POT CLAVULANATE 875-125 MG PO TABS
ORAL_TABLET | ORAL | 0 refills | Status: DC
  Filled 2021-08-14: qty 20, 10d supply, fill #0

## 2021-09-08 ENCOUNTER — Other Ambulatory Visit: Payer: Self-pay

## 2021-09-08 MED ORDER — CELECOXIB 200 MG PO CAPS
ORAL_CAPSULE | ORAL | 1 refills | Status: AC
  Filled 2021-09-08: qty 30, 30d supply, fill #0

## 2021-09-21 ENCOUNTER — Other Ambulatory Visit: Payer: Self-pay

## 2021-09-21 MED ORDER — LEVOTHYROXINE SODIUM 100 MCG PO TABS
100.0000 ug | ORAL_TABLET | Freq: Every day | ORAL | 1 refills | Status: DC
  Filled 2021-09-21: qty 90, 90d supply, fill #0
  Filled 2021-12-22: qty 90, 90d supply, fill #1

## 2021-09-30 ENCOUNTER — Other Ambulatory Visit: Payer: Self-pay | Admitting: Nurse Practitioner

## 2021-09-30 DIAGNOSIS — Z1231 Encounter for screening mammogram for malignant neoplasm of breast: Secondary | ICD-10-CM

## 2021-10-19 ENCOUNTER — Other Ambulatory Visit: Payer: Self-pay

## 2021-11-03 ENCOUNTER — Other Ambulatory Visit: Payer: Self-pay

## 2021-11-03 ENCOUNTER — Ambulatory Visit
Admission: RE | Admit: 2021-11-03 | Discharge: 2021-11-03 | Disposition: A | Discharge status: Home / Self Care | Payer: BC Managed Care – PPO | Source: Ambulatory Visit | Attending: Nurse Practitioner | Admitting: Nurse Practitioner

## 2021-11-03 DIAGNOSIS — Z1231 Encounter for screening mammogram for malignant neoplasm of breast: Secondary | ICD-10-CM | POA: Insufficient documentation

## 2021-11-03 MED ORDER — AMOXICILLIN 875 MG PO TABS
ORAL_TABLET | ORAL | 0 refills | Status: DC
  Filled 2021-11-03: qty 14, 7d supply, fill #0

## 2021-11-13 IMAGING — MG DIGITAL SCREENING BILAT W/ TOMO W/ CAD
8 series · 8 of 24 positions shown · non-contrast
Comparison: Previous exam(s).

CLINICAL DATA: Screening.

EXAM:
DIGITAL SCREENING BILATERAL MAMMOGRAM WITH TOMO AND CAD

[R MLO synth-2D]
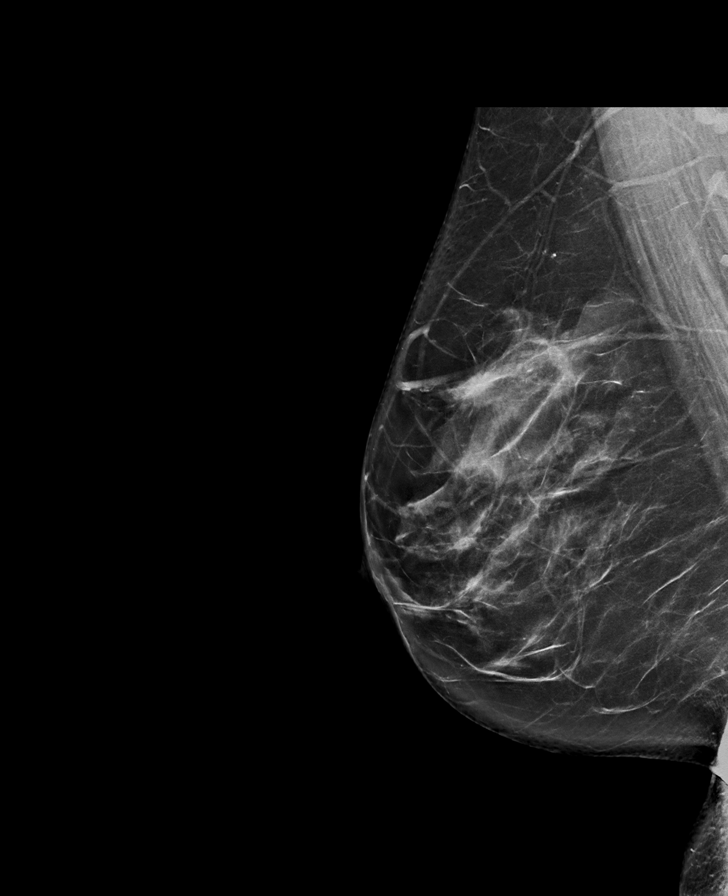

[L MLO synth-2D]
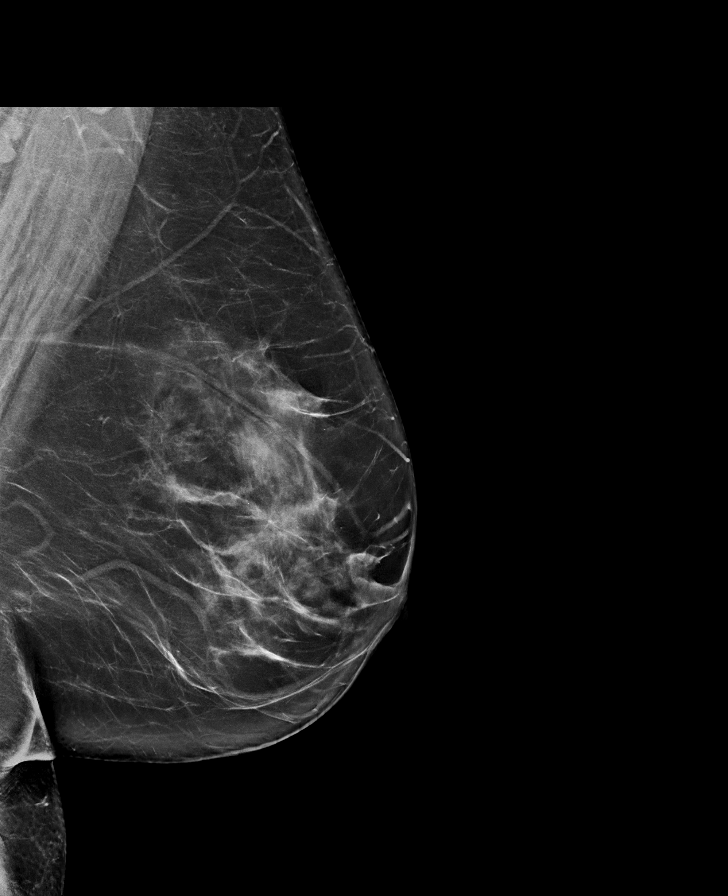

[R CC synth-2D]
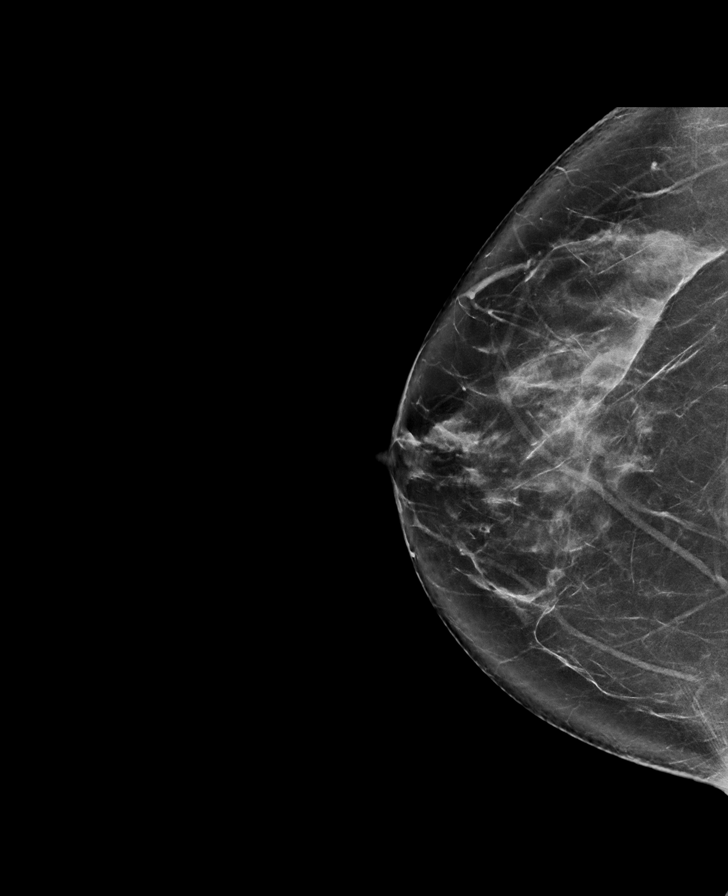

[L CC synth-2D]
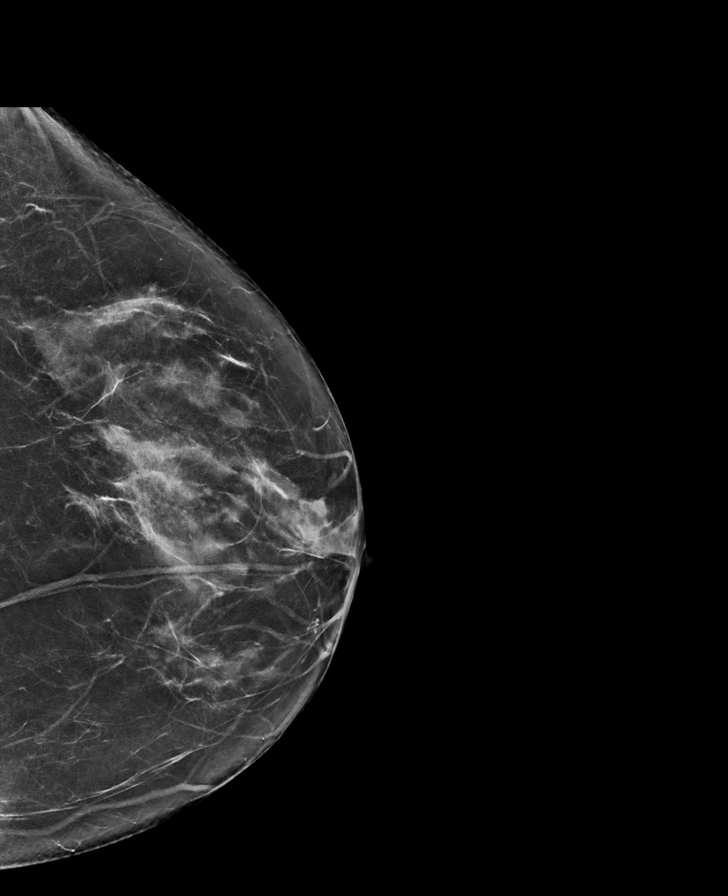

[L CC tomo · tomo slice 37/72.0]
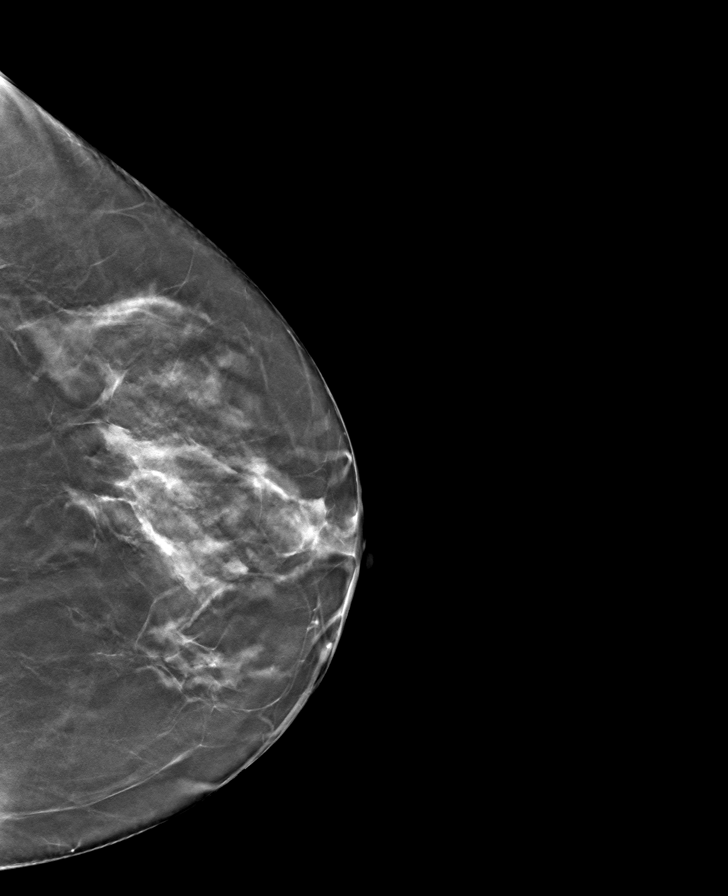

[R CC tomo · tomo slice 42/83.0]
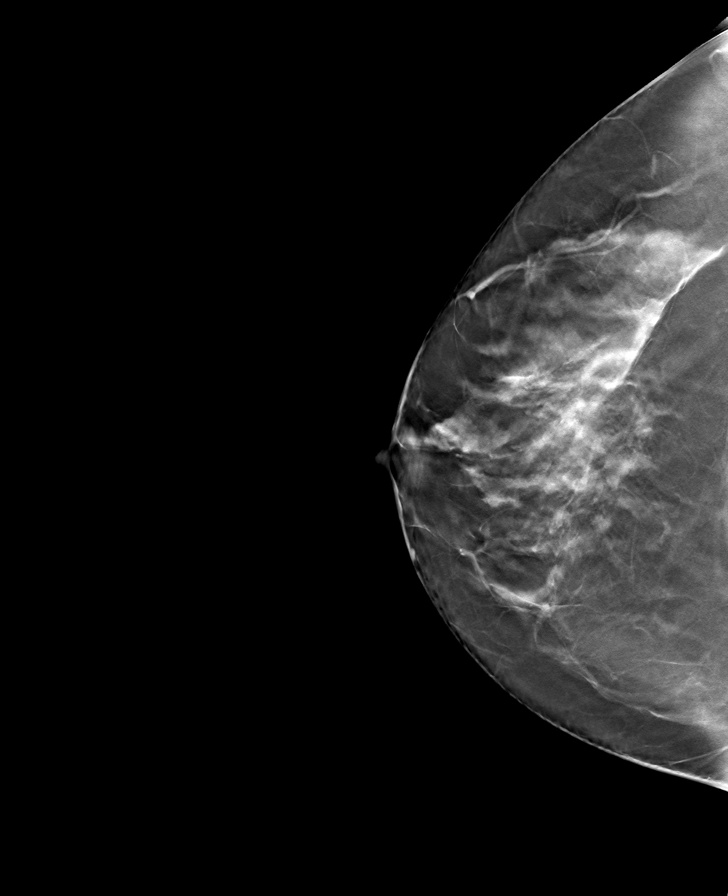

[R MLO tomo · tomo slice 43/84.0]
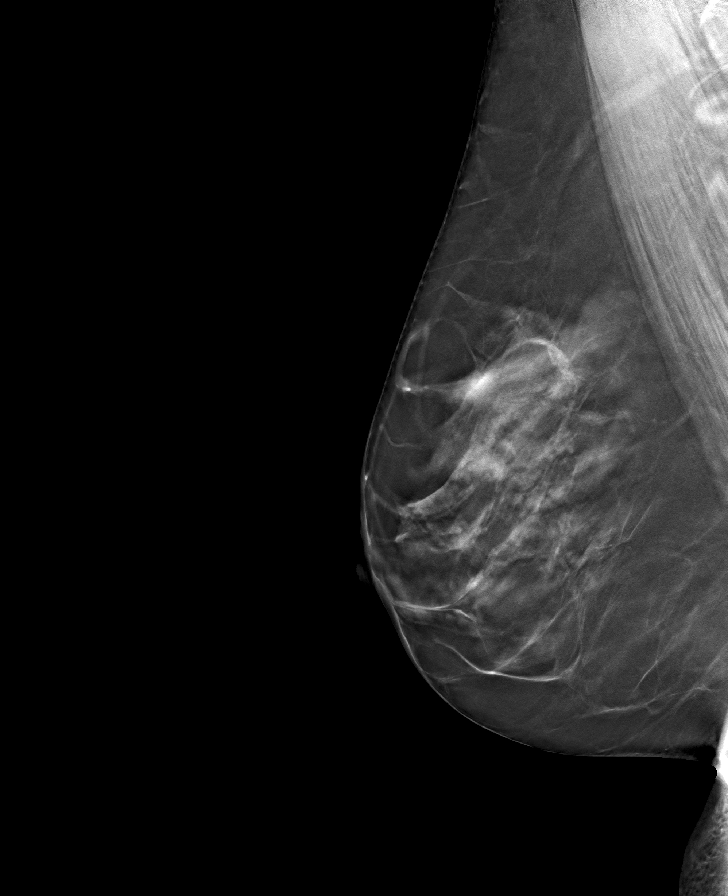

[L MLO tomo · tomo slice 45/88.0]
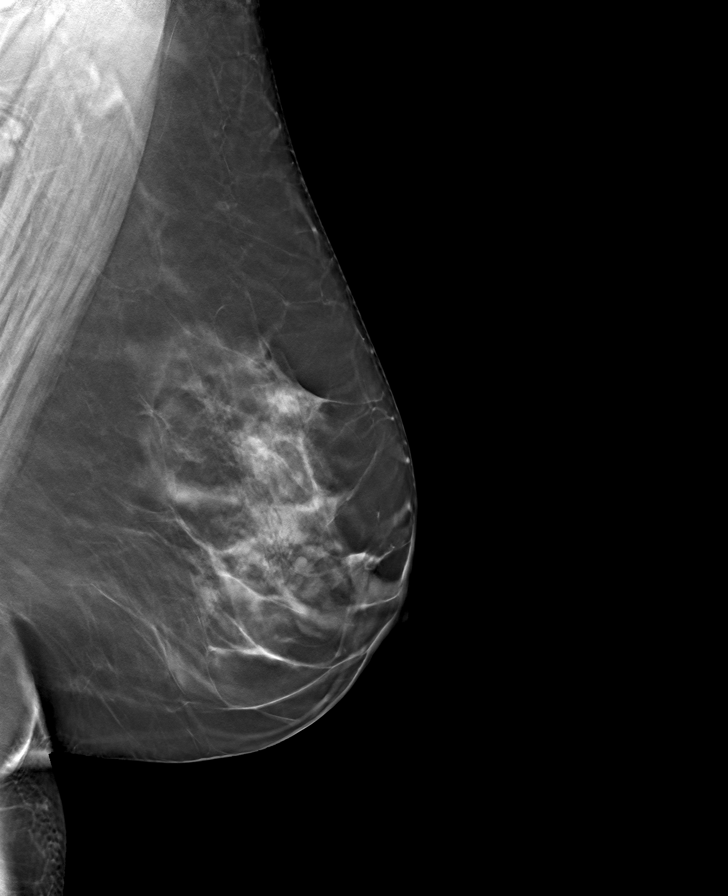

[8 of 24 positions shown; findings below may reference images not displayed]

ACR Breast Density Category c: The breast tissue is heterogeneously
dense, which may obscure small masses.
FINDINGS: There are no findings suspicious for malignancy. Images were
processed with CAD.
IMPRESSION: No mammographic evidence of malignancy. A result letter of this
screening mammogram will be mailed directly to the patient.

RECOMMENDATION:
Screening mammogram in one year. (Code:[id number])

BI-RADS CATEGORY  1: Negative.

## 2021-12-22 ENCOUNTER — Other Ambulatory Visit: Payer: Self-pay

## 2021-12-23 ENCOUNTER — Other Ambulatory Visit: Payer: Self-pay

## 2022-01-11 ENCOUNTER — Other Ambulatory Visit: Payer: Self-pay

## 2022-02-08 ENCOUNTER — Other Ambulatory Visit: Payer: Self-pay

## 2022-02-08 MED ORDER — OZEMPIC (0.25 OR 0.5 MG/DOSE) 2 MG/3ML ~~LOC~~ SOPN
PEN_INJECTOR | SUBCUTANEOUS | 1 refills | Status: DC
  Filled 2022-02-08: qty 3, 28d supply, fill #0

## 2022-02-09 ENCOUNTER — Other Ambulatory Visit: Payer: Self-pay

## 2022-03-17 ENCOUNTER — Other Ambulatory Visit: Payer: Self-pay

## 2022-03-17 MED ORDER — CIPROFLOXACIN HCL 500 MG PO TABS
ORAL_TABLET | Freq: Every day | ORAL | 0 refills | Status: DC
  Filled 2022-03-17: qty 7, 7d supply, fill #0

## 2022-03-26 ENCOUNTER — Other Ambulatory Visit: Payer: Self-pay

## 2022-03-26 MED ORDER — LEVOTHYROXINE SODIUM 100 MCG PO TABS
100.0000 ug | ORAL_TABLET | Freq: Every day | ORAL | 1 refills | Status: DC
  Filled 2022-03-26: qty 90, 90d supply, fill #0
  Filled 2022-06-27: qty 90, 90d supply, fill #1

## 2022-04-20 ENCOUNTER — Other Ambulatory Visit: Payer: Self-pay

## 2022-04-20 MED ORDER — OMEPRAZOLE 20 MG PO CPDR
DELAYED_RELEASE_CAPSULE | ORAL | 1 refills | Status: DC
  Filled 2022-04-20: qty 30, 30d supply, fill #0
  Filled 2022-05-21: qty 30, 30d supply, fill #1
  Filled 2022-06-21: qty 30, 30d supply, fill #2
  Filled 2022-07-26: qty 30, 30d supply, fill #3

## 2022-05-04 ENCOUNTER — Other Ambulatory Visit: Payer: Self-pay

## 2022-05-04 MED ORDER — AMOXICILLIN-POT CLAVULANATE 875-125 MG PO TABS
1.0000 | ORAL_TABLET | Freq: Two times a day (BID) | ORAL | 0 refills | Status: DC
  Filled 2022-05-04: qty 14, 7d supply, fill #0

## 2022-05-19 ENCOUNTER — Other Ambulatory Visit: Payer: Self-pay

## 2022-05-19 MED ORDER — AMOXICILLIN-POT CLAVULANATE 875-125 MG PO TABS
1.0000 | ORAL_TABLET | Freq: Two times a day (BID) | ORAL | 0 refills | Status: DC
  Filled 2022-05-19: qty 20, 10d supply, fill #0

## 2022-05-21 ENCOUNTER — Other Ambulatory Visit: Payer: Self-pay

## 2022-06-21 ENCOUNTER — Other Ambulatory Visit: Payer: Self-pay

## 2022-06-21 MED ORDER — INFLUENZA VAC SPLIT QUAD 0.5 ML IM SUSY
PREFILLED_SYRINGE | INTRAMUSCULAR | 0 refills | Status: DC
  Filled 2022-06-21: qty 0.5, 1d supply, fill #0

## 2022-06-28 ENCOUNTER — Encounter (INDEPENDENT_AMBULATORY_CARE_PROVIDER_SITE_OTHER): Payer: Self-pay

## 2022-06-28 ENCOUNTER — Other Ambulatory Visit: Payer: Self-pay

## 2022-07-26 ENCOUNTER — Other Ambulatory Visit: Payer: Self-pay

## 2022-08-10 ENCOUNTER — Other Ambulatory Visit: Payer: Self-pay

## 2022-08-10 MED ORDER — VALACYCLOVIR HCL 1 G PO TABS
2.0000 g | ORAL_TABLET | Freq: Every day | ORAL | 1 refills | Status: AC
  Filled 2022-08-10 (×2): qty 10, 5d supply, fill #0

## 2022-08-20 ENCOUNTER — Other Ambulatory Visit: Payer: Self-pay

## 2022-08-25 ENCOUNTER — Other Ambulatory Visit: Payer: Self-pay

## 2022-08-25 MED ORDER — OMEPRAZOLE 20 MG PO CPDR
20.0000 mg | DELAYED_RELEASE_CAPSULE | Freq: Every day | ORAL | 3 refills | Status: DC
  Filled 2022-08-25: qty 90, 90d supply, fill #0
  Filled 2022-11-19: qty 90, 90d supply, fill #1
  Filled 2023-02-28: qty 90, 90d supply, fill #2
  Filled 2023-06-08: qty 90, 90d supply, fill #3

## 2022-09-08 ENCOUNTER — Other Ambulatory Visit: Payer: Self-pay

## 2022-09-10 ENCOUNTER — Other Ambulatory Visit: Payer: Self-pay

## 2022-09-10 MED ORDER — AZITHROMYCIN 250 MG PO TABS
ORAL_TABLET | ORAL | 0 refills | Status: DC
  Filled 2022-09-10: qty 6, 5d supply, fill #0

## 2022-09-22 ENCOUNTER — Other Ambulatory Visit: Payer: Self-pay

## 2022-09-22 MED ORDER — LEVOTHYROXINE SODIUM 100 MCG PO TABS
100.0000 ug | ORAL_TABLET | Freq: Every day | ORAL | 1 refills | Status: DC
  Filled 2022-09-22: qty 90, 90d supply, fill #0
  Filled 2022-12-21: qty 90, 90d supply, fill #1

## 2022-09-24 ENCOUNTER — Other Ambulatory Visit: Payer: Self-pay

## 2022-10-06 ENCOUNTER — Other Ambulatory Visit: Payer: Self-pay | Admitting: Nurse Practitioner

## 2022-10-06 DIAGNOSIS — Z1231 Encounter for screening mammogram for malignant neoplasm of breast: Secondary | ICD-10-CM

## 2022-11-09 ENCOUNTER — Ambulatory Visit
Admission: RE | Admit: 2022-11-09 | Discharge: 2022-11-09 | Disposition: A | Discharge status: Home / Self Care | Payer: BC Managed Care – PPO | Source: Ambulatory Visit | Attending: Nurse Practitioner | Admitting: Nurse Practitioner

## 2022-11-09 ENCOUNTER — Encounter: Payer: Self-pay | Admitting: Radiology

## 2022-11-09 DIAGNOSIS — Z1231 Encounter for screening mammogram for malignant neoplasm of breast: Secondary | ICD-10-CM | POA: Diagnosis present

## 2022-11-19 ENCOUNTER — Other Ambulatory Visit: Payer: Self-pay

## 2022-12-21 ENCOUNTER — Other Ambulatory Visit: Payer: Self-pay

## 2023-02-28 ENCOUNTER — Other Ambulatory Visit: Payer: Self-pay

## 2023-03-22 ENCOUNTER — Other Ambulatory Visit: Payer: BC Managed Care – PPO

## 2023-03-22 ENCOUNTER — Other Ambulatory Visit: Payer: Self-pay

## 2023-03-22 DIAGNOSIS — E669 Obesity, unspecified: Secondary | ICD-10-CM

## 2023-03-22 DIAGNOSIS — E785 Hyperlipidemia, unspecified: Secondary | ICD-10-CM

## 2023-03-22 DIAGNOSIS — E039 Hypothyroidism, unspecified: Secondary | ICD-10-CM

## 2023-03-22 DIAGNOSIS — I10 Essential (primary) hypertension: Secondary | ICD-10-CM

## 2023-03-23 LAB — LIPID PANEL
Chol/HDL Ratio: 4 ratio (ref 0.0–4.4)
Cholesterol, Total: 285 mg/dL — ABNORMAL HIGH (ref 100–199)
HDL: 71 mg/dL (ref >39)
LDL Chol Calc (NIH): 199 mg/dL — ABNORMAL HIGH (ref 0–99)
Triglycerides: 88 mg/dL (ref 0–149)
VLDL Cholesterol Cal: 15 mg/dL (ref 5–40)

## 2023-03-23 LAB — CBC WITH DIFFERENTIAL
Basophils Absolute: 0 10*3/uL (ref 0.0–0.2)
Basos: 1 %
EOS (ABSOLUTE): 0.2 10*3/uL (ref 0.0–0.4)
Eos: 3 %
Hematocrit: 45.1 % (ref 34.0–46.6)
Hemoglobin: 14.8 g/dL (ref 11.1–15.9)
Immature Grans (Abs): 0 10*3/uL (ref 0.0–0.1)
Immature Granulocytes: 0 %
Lymphocytes Absolute: 2.4 10*3/uL (ref 0.7–3.1)
Lymphs: 37 %
MCH: 29.7 pg (ref 26.6–33.0)
MCHC: 32.8 g/dL (ref 31.5–35.7)
MCV: 90 fL (ref 79–97)
Monocytes Absolute: 0.4 10*3/uL (ref 0.1–0.9)
Monocytes: 6 %
Neutrophils Absolute: 3.6 10*3/uL (ref 1.4–7.0)
Neutrophils: 53 %
RBC: 4.99 x10E6/uL (ref 3.77–5.28)
RDW: 13.6 % (ref 11.7–15.4)
WBC: 6.6 10*3/uL (ref 3.4–10.8)

## 2023-03-23 LAB — HEMOGLOBIN A1C
Est. average glucose Bld gHb Est-mCnc: 117 mg/dL
Hgb A1c MFr Bld: 5.7 % — ABNORMAL HIGH (ref 4.8–5.6)

## 2023-03-23 LAB — CMP14+EGFR
ALT: 17 IU/L (ref 0–32)
AST: 16 IU/L (ref 0–40)
Albumin: 4.3 g/dL (ref 3.8–4.9)
Alkaline Phosphatase: 91 IU/L (ref 44–121)
BUN/Creatinine Ratio: 10 (ref 9–23)
BUN: 11 mg/dL (ref 6–24)
Bilirubin Total: 0.4 mg/dL (ref 0.0–1.2)
CO2: 24 mmol/L (ref 20–29)
Calcium: 9.9 mg/dL (ref 8.7–10.2)
Chloride: 105 mmol/L (ref 96–106)
Creatinine, Ser: 1.1 mg/dL — ABNORMAL HIGH (ref 0.57–1.00)
Globulin, Total: 2.6 g/dL (ref 1.5–4.5)
Glucose: 71 mg/dL (ref 70–99)
Potassium: 4.7 mmol/L (ref 3.5–5.2)
Sodium: 142 mmol/L (ref 134–144)
Total Protein: 6.9 g/dL (ref 6.0–8.5)
eGFR: 60 mL/min/{1.73_m2} (ref >59)

## 2023-03-23 LAB — TSH: TSH: 0.486 u[IU]/mL (ref 0.450–4.500)

## 2023-03-25 ENCOUNTER — Other Ambulatory Visit: Payer: Self-pay

## 2023-03-25 MED ORDER — LEVOTHYROXINE SODIUM 100 MCG PO TABS
100.0000 ug | ORAL_TABLET | Freq: Every day | ORAL | 3 refills | Status: DC
  Filled 2023-03-25: qty 90, 90d supply, fill #0
  Filled 2023-06-08: qty 90, 90d supply, fill #1

## 2023-03-29 ENCOUNTER — Other Ambulatory Visit: Payer: Self-pay

## 2023-03-29 MED ORDER — OZEMPIC (0.25 OR 0.5 MG/DOSE) 2 MG/3ML ~~LOC~~ SOPN
PEN_INJECTOR | SUBCUTANEOUS | 1 refills | Status: AC
Start: 2023-03-29 — End: ?
  Filled 2023-03-29: qty 3, 42d supply, fill #0

## 2023-03-29 MED ORDER — ROSUVASTATIN CALCIUM 5 MG PO TABS
5.0000 mg | ORAL_TABLET | Freq: Every day | ORAL | 0 refills | Status: AC
  Filled 2023-03-29: qty 30, 30d supply, fill #0

## 2023-03-30 ENCOUNTER — Other Ambulatory Visit: Payer: Self-pay

## 2023-04-05 ENCOUNTER — Other Ambulatory Visit: Payer: Self-pay

## 2023-05-30 ENCOUNTER — Other Ambulatory Visit: Payer: Self-pay

## 2023-05-30 MED ORDER — CHLORHEXIDINE GLUCONATE 0.12 % MT SOLN
5.0000 mL | Freq: Two times a day (BID) | OROMUCOSAL | 1 refills | Status: AC
  Filled 2023-05-30: qty 473, 24d supply, fill #0

## 2023-05-30 MED ORDER — IBUPROFEN 800 MG PO TABS
800.0000 mg | ORAL_TABLET | Freq: Three times a day (TID) | ORAL | 0 refills | Status: AC
  Filled 2023-05-30: qty 30, 10d supply, fill #0

## 2023-05-30 MED ORDER — AMOXICILLIN 500 MG PO CAPS
500.0000 mg | ORAL_CAPSULE | Freq: Three times a day (TID) | ORAL | 0 refills | Status: DC
  Filled 2023-05-30: qty 14, 4d supply, fill #0
  Filled 2023-05-30: qty 7, 3d supply, fill #0

## 2023-05-30 MED ORDER — ACETAMINOPHEN-CODEINE 300-30 MG PO TABS
1.0000 | ORAL_TABLET | ORAL | 0 refills | Status: AC | PRN
  Filled 2023-05-30: qty 5, 1d supply, fill #0

## 2023-06-08 ENCOUNTER — Other Ambulatory Visit: Payer: Self-pay

## 2023-07-27 ENCOUNTER — Other Ambulatory Visit: Payer: Self-pay

## 2023-07-27 MED ORDER — AMOXICILLIN-POT CLAVULANATE 875-125 MG PO TABS
1.0000 | ORAL_TABLET | Freq: Two times a day (BID) | ORAL | 0 refills | Status: DC
  Filled 2023-07-27: qty 20, 10d supply, fill #0

## 2023-08-29 ENCOUNTER — Other Ambulatory Visit: Payer: Self-pay | Admitting: Family

## 2023-08-29 DIAGNOSIS — M2550 Pain in unspecified joint: Secondary | ICD-10-CM

## 2023-08-29 DIAGNOSIS — R4586 Emotional lability: Secondary | ICD-10-CM

## 2023-08-30 LAB — FSH+PROG+E2+SHBG
Estradiol: <5 pg/mL
FSH: 94.5 m[IU]/mL
Progesterone: 0.2 ng/mL
Sex Hormone Binding: 82.3 nmol/L (ref 17.3–125.0)

## 2023-08-30 LAB — RHEUMATOID ARTHRITIS PROFILE
Cyclic Citrullin Peptide Ab: 5 U (ref 0–19)
Rheumatoid fact SerPl-aCnc: 11.3 [IU]/mL (ref <14.0)

## 2023-08-30 LAB — TESTOSTERONE: Testosterone: 10 ng/dL (ref 4–50)

## 2023-09-05 ENCOUNTER — Other Ambulatory Visit: Payer: Self-pay

## 2023-09-05 MED ORDER — OMEPRAZOLE 20 MG PO CPDR
20.0000 mg | DELAYED_RELEASE_CAPSULE | Freq: Every day | ORAL | 3 refills | Status: DC
  Filled 2023-09-05: qty 90, 90d supply, fill #0
  Filled 2023-12-08: qty 90, 90d supply, fill #1
  Filled 2024-03-06: qty 90, 90d supply, fill #2
  Filled 2024-06-05: qty 90, 90d supply, fill #3
  Filled 2024-06-06: qty 30, 30d supply, fill #3
  Filled 2024-07-06: qty 30, 30d supply, fill #0
  Filled 2024-08-06: qty 30, 30d supply, fill #1

## 2023-09-13 ENCOUNTER — Other Ambulatory Visit: Payer: Self-pay

## 2023-09-13 ENCOUNTER — Other Ambulatory Visit: Payer: Self-pay | Admitting: Obstetrics and Gynecology

## 2023-09-13 DIAGNOSIS — Z1231 Encounter for screening mammogram for malignant neoplasm of breast: Secondary | ICD-10-CM

## 2023-09-13 MED ORDER — ESTRADIOL 0.1 MG/24HR TD PTTW
1.0000 | MEDICATED_PATCH | TRANSDERMAL | 1 refills | Status: DC
  Filled 2023-09-13: qty 24, 84d supply, fill #0
  Filled 2023-09-14: qty 8, 28d supply, fill #0

## 2023-09-13 MED ORDER — PROGESTERONE 200 MG PO CAPS
200.0000 mg | ORAL_CAPSULE | Freq: Every evening | ORAL | 1 refills | Status: AC
  Filled 2023-09-13: qty 90, 90d supply, fill #0
  Filled 2023-09-14: qty 30, 30d supply, fill #0

## 2023-09-13 MED ORDER — AMOXICILLIN-POT CLAVULANATE 875-125 MG PO TABS
1.0000 | ORAL_TABLET | Freq: Two times a day (BID) | ORAL | 0 refills | Status: DC
  Filled 2023-09-13: qty 14, 7d supply, fill #0

## 2023-09-13 MED ORDER — HYDROCODONE-ACETAMINOPHEN 5-325 MG PO TABS
1.0000 | ORAL_TABLET | Freq: Four times a day (QID) | ORAL | 0 refills | Status: AC | PRN
  Filled 2023-09-13: qty 5, 2d supply, fill #0

## 2023-09-13 MED ORDER — CHLORHEXIDINE GLUCONATE 0.12 % MT SOLN
5.0000 mL | Freq: Two times a day (BID) | OROMUCOSAL | 1 refills | Status: AC
  Filled 2023-09-13: qty 473, 24d supply, fill #0

## 2023-09-13 MED ORDER — IBUPROFEN 800 MG PO TABS
800.0000 mg | ORAL_TABLET | Freq: Three times a day (TID) | ORAL | 0 refills | Status: AC
  Filled 2023-09-13: qty 30, 10d supply, fill #0

## 2023-09-14 ENCOUNTER — Other Ambulatory Visit: Payer: Self-pay | Admitting: Cardiology

## 2023-09-14 ENCOUNTER — Other Ambulatory Visit: Payer: Self-pay

## 2023-09-15 ENCOUNTER — Other Ambulatory Visit: Payer: Self-pay

## 2023-09-15 MED ORDER — LEVOTHYROXINE SODIUM 100 MCG PO TABS
100.0000 ug | ORAL_TABLET | Freq: Every day | ORAL | 3 refills | Status: AC
  Filled 2023-09-15: qty 90, 90d supply, fill #0
  Filled 2023-12-19: qty 90, 90d supply, fill #1
  Filled 2024-03-21: qty 90, 90d supply, fill #2
  Filled 2024-06-05: qty 90, 90d supply, fill #3
  Filled 2024-06-06: qty 30, 30d supply, fill #3

## 2023-09-28 ENCOUNTER — Other Ambulatory Visit: Payer: Self-pay

## 2023-09-28 ENCOUNTER — Other Ambulatory Visit: Payer: Self-pay | Admitting: Family

## 2023-09-28 MED ORDER — OSELTAMIVIR PHOSPHATE 75 MG PO CAPS
75.0000 mg | ORAL_CAPSULE | Freq: Two times a day (BID) | ORAL | 0 refills | Status: AC
  Filled 2023-09-28: qty 10, 5d supply, fill #0

## 2023-11-08 ENCOUNTER — Ambulatory Visit

## 2023-11-08 DIAGNOSIS — K317 Polyp of stomach and duodenum: Secondary | ICD-10-CM | POA: Diagnosis not present

## 2023-11-08 DIAGNOSIS — Z1211 Encounter for screening for malignant neoplasm of colon: Secondary | ICD-10-CM | POA: Diagnosis present

## 2023-11-08 DIAGNOSIS — D3610 Benign neoplasm of peripheral nerves and autonomic nervous system, unspecified: Secondary | ICD-10-CM | POA: Diagnosis not present

## 2023-11-08 DIAGNOSIS — K2960 Other gastritis without bleeding: Secondary | ICD-10-CM | POA: Diagnosis not present

## 2023-11-08 DIAGNOSIS — K640 First degree hemorrhoids: Secondary | ICD-10-CM | POA: Diagnosis not present

## 2023-11-08 DIAGNOSIS — D122 Benign neoplasm of ascending colon: Secondary | ICD-10-CM | POA: Diagnosis not present

## 2023-11-08 DIAGNOSIS — Z860101 Personal history of adenomatous and serrated colon polyps: Secondary | ICD-10-CM | POA: Diagnosis not present

## 2023-11-15 ENCOUNTER — Ambulatory Visit
Admission: RE | Admit: 2023-11-15 | Discharge: 2023-11-15 | Disposition: A | Discharge status: Home / Self Care | Payer: BC Managed Care – PPO | Source: Ambulatory Visit | Attending: Obstetrics and Gynecology | Admitting: Obstetrics and Gynecology

## 2023-11-15 DIAGNOSIS — Z1231 Encounter for screening mammogram for malignant neoplasm of breast: Secondary | ICD-10-CM | POA: Diagnosis present

## 2023-12-08 ENCOUNTER — Other Ambulatory Visit: Payer: Self-pay

## 2023-12-13 ENCOUNTER — Telehealth: Payer: Self-pay

## 2023-12-13 NOTE — Telephone Encounter (Signed)
 Patient is requesting Qysima please advise

## 2023-12-15 MED ORDER — QSYMIA 7.5-46 MG PO CP24: 1.0000 | ORAL_CAPSULE | Freq: Every day | ORAL | 2 refills | Status: AC

## 2023-12-15 MED ORDER — QSYMIA 3.75-23 MG PO CP24: 1.0000 | ORAL_CAPSULE | Freq: Every day | ORAL | 0 refills | Status: AC

## 2023-12-15 NOTE — Addendum Note (Signed)
 Addended by: Evander Hills on: 12/15/2023 03:17 PM   Modules accepted: Orders

## 2023-12-19 ENCOUNTER — Other Ambulatory Visit: Payer: Self-pay

## 2023-12-30 ENCOUNTER — Other Ambulatory Visit: Payer: Self-pay

## 2023-12-30 DIAGNOSIS — R399 Unspecified symptoms and signs involving the genitourinary system: Secondary | ICD-10-CM

## 2023-12-30 LAB — POCT URINALYSIS DIPSTICK
Bilirubin, UA: NEGATIVE
Glucose, UA: NEGATIVE
Ketones, UA: NEGATIVE
Nitrite, UA: NEGATIVE
Protein, UA: POSITIVE — AB
Spec Grav, UA: 1.02 (ref 1.010–1.025)
Urobilinogen, UA: 0.2 U/dL
pH, UA: 6 (ref 5.0–8.0)

## 2023-12-30 MED ORDER — NITROFURANTOIN MONOHYD MACRO 100 MG PO CAPS
100.0000 mg | ORAL_CAPSULE | Freq: Two times a day (BID) | ORAL | 0 refills | Status: AC
  Filled 2023-12-30: qty 14, 7d supply, fill #0

## 2024-01-02 LAB — URINE CULTURE

## 2024-01-03 ENCOUNTER — Other Ambulatory Visit: Payer: Self-pay

## 2024-01-03 MED ORDER — SULFAMETHOXAZOLE-TRIMETHOPRIM 400-80 MG PO TABS
1.0000 | ORAL_TABLET | Freq: Two times a day (BID) | ORAL | 0 refills | Status: DC
  Filled 2024-01-03: qty 20, 10d supply, fill #0

## 2024-01-18 ENCOUNTER — Other Ambulatory Visit: Payer: Self-pay | Admitting: Family

## 2024-01-18 MED ORDER — SULFAMETHOXAZOLE-TRIMETHOPRIM 400-80 MG PO TABS: 1.0000 | ORAL_TABLET | Freq: Two times a day (BID) | ORAL | 0 refills | Status: AC

## 2024-02-06 ENCOUNTER — Other Ambulatory Visit: Payer: Self-pay

## 2024-02-06 MED ORDER — HYDROCODONE-ACETAMINOPHEN 5-325 MG PO TABS
1.0000 | ORAL_TABLET | ORAL | 0 refills | Status: AC | PRN
  Filled 2024-02-06: qty 5, 1d supply, fill #0

## 2024-02-07 ENCOUNTER — Other Ambulatory Visit: Payer: Self-pay

## 2024-02-07 MED ORDER — AMOXICILLIN-POT CLAVULANATE 875-125 MG PO TABS
1.0000 | ORAL_TABLET | Freq: Two times a day (BID) | ORAL | 0 refills | Status: AC
  Filled 2024-02-07: qty 14, 7d supply, fill #0

## 2024-03-06 ENCOUNTER — Other Ambulatory Visit: Payer: Self-pay

## 2024-06-05 ENCOUNTER — Other Ambulatory Visit: Payer: Self-pay

## 2024-06-06 ENCOUNTER — Other Ambulatory Visit: Payer: Self-pay

## 2024-06-12 ENCOUNTER — Other Ambulatory Visit

## 2024-06-25 ENCOUNTER — Other Ambulatory Visit: Payer: Self-pay

## 2024-07-06 ENCOUNTER — Other Ambulatory Visit (HOSPITAL_COMMUNITY): Payer: Self-pay

## 2024-07-11 ENCOUNTER — Other Ambulatory Visit: Payer: Self-pay

## 2024-07-11 MED ORDER — LEVOTHYROXINE SODIUM 88 MCG PO TABS
88.0000 ug | ORAL_TABLET | Freq: Every day | ORAL | 3 refills | Status: AC
  Filled 2024-07-11: qty 30, 30d supply, fill #0
  Filled 2024-08-13: qty 30, 30d supply, fill #1
  Filled 2024-09-12: qty 30, 30d supply, fill #0
  Filled 2024-09-12: qty 30, 30d supply, fill #2

## 2024-07-27 ENCOUNTER — Other Ambulatory Visit: Payer: Self-pay | Admitting: Emergency Medicine

## 2024-07-27 DIAGNOSIS — Z87891 Personal history of nicotine dependence: Secondary | ICD-10-CM

## 2024-07-27 DIAGNOSIS — Z122 Encounter for screening for malignant neoplasm of respiratory organs: Secondary | ICD-10-CM

## 2024-08-06 ENCOUNTER — Other Ambulatory Visit: Payer: Self-pay

## 2024-08-06 ENCOUNTER — Ambulatory Visit: Admission: RE | Admit: 2024-08-06 | Discharge: 2024-08-06 | Attending: Emergency Medicine

## 2024-08-06 DIAGNOSIS — Z87891 Personal history of nicotine dependence: Secondary | ICD-10-CM

## 2024-08-06 DIAGNOSIS — Z122 Encounter for screening for malignant neoplasm of respiratory organs: Secondary | ICD-10-CM

## 2024-08-13 ENCOUNTER — Other Ambulatory Visit: Payer: Self-pay

## 2024-09-05 ENCOUNTER — Other Ambulatory Visit: Payer: Self-pay

## 2024-09-05 MED ORDER — OMEPRAZOLE 20 MG PO CPDR
20.0000 mg | DELAYED_RELEASE_CAPSULE | Freq: Every day | ORAL | 3 refills | Status: AC
  Filled 2024-09-05: qty 30, 30d supply, fill #0

## 2024-09-12 ENCOUNTER — Other Ambulatory Visit (HOSPITAL_COMMUNITY): Payer: Self-pay
